# Patient Record
Sex: Female | Born: 1975 | Race: White | Hispanic: No | Marital: Married | Smoking: Former smoker
Health system: Southern US, Community
[De-identification: ages and names within clinical notes are randomized; demographics above are authoritative.]

## PROBLEM LIST (undated history)

## (undated) DIAGNOSIS — J302 Other seasonal allergic rhinitis: Secondary | ICD-10-CM

## (undated) DIAGNOSIS — T7840XA Allergy, unspecified, initial encounter: Secondary | ICD-10-CM

## (undated) DIAGNOSIS — F32A Depression, unspecified: Secondary | ICD-10-CM

## (undated) DIAGNOSIS — J45909 Unspecified asthma, uncomplicated: Secondary | ICD-10-CM

## (undated) DIAGNOSIS — F419 Anxiety disorder, unspecified: Secondary | ICD-10-CM

## (undated) HISTORY — PX: OTHER SURGICAL HISTORY: SHX169

## (undated) HISTORY — PX: ABDOMINAL SURGERY: SHX537

## (undated) HISTORY — PX: TUBAL LIGATION: SHX77

## (undated) HISTORY — DX: Depression, unspecified: F32.A

## (undated) HISTORY — DX: Allergy, unspecified, initial encounter: T78.40XA

---

## 2011-10-16 ENCOUNTER — Encounter: Payer: Self-pay | Admitting: *Deleted

## 2011-10-16 ENCOUNTER — Emergency Department (INDEPENDENT_AMBULATORY_CARE_PROVIDER_SITE_OTHER)
Admission: EM | Admit: 2011-10-16 | Discharge: 2011-10-16 | Disposition: A | Discharge status: Home / Self Care | Source: Home / Self Care | Attending: Family Medicine | Admitting: Family Medicine

## 2011-10-16 DIAGNOSIS — M940 Chondrocostal junction syndrome [Tietze]: Secondary | ICD-10-CM

## 2011-10-16 DIAGNOSIS — J069 Acute upper respiratory infection, unspecified: Secondary | ICD-10-CM

## 2011-10-16 DIAGNOSIS — J029 Acute pharyngitis, unspecified: Secondary | ICD-10-CM

## 2011-10-16 HISTORY — DX: Anxiety disorder, unspecified: F41.9

## 2011-10-16 LAB — POCT RAPID STREP A (OFFICE): Rapid Strep A Screen: NEGATIVE

## 2011-10-16 MED ORDER — AZITHROMYCIN 250 MG PO TABS: ORAL_TABLET | ORAL | Status: AC

## 2011-10-16 MED ORDER — BENZONATATE 200 MG PO CAPS: 200.0000 mg | ORAL_CAPSULE | Freq: Every day | ORAL | Status: AC

## 2011-10-16 NOTE — ED Provider Notes (Signed)
History     CSN: [id number]  Arrival date & time 10/16/11  1731   First MD Initiated Contact with Patient 10/16/11 1754      Chief Complaint  Patient presents with  . Nasal Congestion  . Sore Throat     HPI Comments: Patient complains of approximately 3 day history of gradually progressive URI symptoms beginning with a mild sore throat (now persistent), followed by progressive nasal congestion.  A cough started next.   Complains of fatigue and initial myalgias.  Cough is now worse at night and generally non-productive during the day.  There has been no pleuritic pain, bit she does complain of tightness in her anterior chest.  No shortness of breath or wheezes.  She complains of pressure in her sinuses.  The history is provided by the patient.    Past Medical History  Diagnosis Date  . Anxiety     Past Surgical History  Procedure Date  . Abdominal surgery     c-section x 2     History reviewed. No pertinent family history.  History  Substance Use Topics  . Smoking status: Former Games developer  . Smokeless tobacco: Not on file  . Alcohol Use: Yes    OB History    Grav Para Term Preterm Abortions TAB SAB Ect Mult Living                  Review of Systems + sore throat + cough No pleuritic pain, but has tightness in anterior chest No wheezing + nasal congestion + post-nasal drainage + sinus pain/pressure No itchy/red eyes + left earache No hemoptysis No SOB + fever, + chills No nausea No vomiting No abdominal pain No diarrhea No urinary symptoms No skin rashes + fatigue + myalgias + headache Used OTC meds without relief (Daquil, Nyquil, etc) Allergies  Review of patient's allergies indicates no known allergies.  Home Medications   Current Outpatient Rx  Name Route Sig Dispense Refill  . ESCITALOPRAM OXALATE 20 MG PO TABS Oral Take 20 mg by mouth daily.    . AZITHROMYCIN 250 MG PO TABS  Take 2 tabs today; then begin one tab once daily for 4 more days  (Rx void after 10/24/11) 6 each 0  . BENZONATATE 200 MG PO CAPS Oral Take 1 capsule (200 mg total) by mouth at bedtime. Take as needed for cough 12 capsule 0    BP 138/83  Pulse 71  Temp(Src) 97.8 F (36.6 C) (Oral)  Resp 16  Ht 5\' 7"  (1.702 m)  Wt 163 lb 4 oz (74.05 kg)  BMI 25.57 kg/m2  SpO2 98%  LMP 10/16/2011  Physical Exam Nursing notes and Vital Signs reviewed. Appearance:  Patient appears healthy, stated age, and in no acute distress Eyes:  Pupils are equal, round, and reactive to light and accomodation.  Extraocular movement is intact.  Conjunctivae are not inflamed  Ears:  Canals normal.  Tympanic membranes normal.  Nose:  Mildly congested turbinates.  Mild maxillary sinus tenderness is present.  Pharynx:  Normal Neck:  Supple.   Tender shotty posterior nodes are palpated bilaterally  Lungs:  Clear to auscultation.  Breath sounds are equal.  Chest:  Distinct tenderness to palpation over the mid-sternum.  Heart:  Regular rate and rhythm without murmurs, rubs, or gallops.  Abdomen:  Nontender without masses or hepatosplenomegaly.  Bowel sounds are present.  No CVA or flank tenderness.  Extremities:  No edema.  No calf tenderness Skin:  No rash present.  ED Course  Procedures  none   Labs Reviewed  POCT RAPID STREP A (OFFICE) negative      1. Acute upper respiratory infections of unspecified site   2. Acute pharyngitis   3. Costochondritis, acute       MDM  There is no evidence of bacterial infection today.   Treat symptomatically for now: Rx for Tessalon written if cough develops Take Mucinex D (guaifenesin with decongestant) twice daily for congestion.  Increase fluid intake, rest. May use Afrin nasal spray (or generic oxymetazoline) twice daily for about 5 days.  Also recommend using saline nasal spray several times daily and saline nasal irrigation (AYR is a common brand) Stop all antihistamines for now, and other non-prescription cough/cold  preparations. May take Ibuprofen 200mg , 4 tabs every 8 hours with food for chest/sternum discomfort. Begin Azithromycin if not improving about 5 days or if persistent fever develops (Given a prescription to hold, with an expiration date)  Follow-up with family doctor if not improving 7 to 10 days.        Lattie Haw, MD 10/16/11 [phone]

## 2011-10-16 NOTE — ED Notes (Signed)
Pt c/o sore throat, nasal congestion, fatigue, sinus pain/pressure, and HA x 3 days. She has taken zyrtec, nyquil, dayquil and tylenol sinus.

## 2011-10-16 NOTE — Discharge Instructions (Signed)
Take Mucinex D (guaifenesin with decongestant) twice daily for congestion.  Increase fluid intake, rest. May use Afrin nasal spray (or generic oxymetazoline) twice daily for about 5 days.  Also recommend using saline nasal spray several times daily and saline nasal irrigation (AYR is a common brand) Stop all antihistamines for now, and other non-prescription cough/cold preparations. May take Ibuprofen 200mg , 4 tabs every 8 hours with food for chest/sternum discomfort. Begin Azithromycin if not improving about 5 days or if persistent fever develops. Follow-up with family doctor if not improving 7 to 10 days.

## 2012-04-08 ENCOUNTER — Emergency Department (INDEPENDENT_AMBULATORY_CARE_PROVIDER_SITE_OTHER)
Admission: EM | Admit: 2012-04-08 | Discharge: 2012-04-08 | Disposition: A | Discharge status: Home / Self Care | Source: Home / Self Care | Attending: Family Medicine | Admitting: Family Medicine

## 2012-04-08 ENCOUNTER — Encounter: Payer: Self-pay | Admitting: *Deleted

## 2012-04-08 DIAGNOSIS — M940 Chondrocostal junction syndrome [Tietze]: Secondary | ICD-10-CM

## 2012-04-08 DIAGNOSIS — J069 Acute upper respiratory infection, unspecified: Secondary | ICD-10-CM

## 2012-04-08 MED ORDER — BENZONATATE 200 MG PO CAPS: 200.0000 mg | ORAL_CAPSULE | Freq: Every day | ORAL | Status: AC

## 2012-04-08 MED ORDER — AMOXICILLIN 875 MG PO TABS: 875.0000 mg | ORAL_TABLET | Freq: Two times a day (BID) | ORAL | Status: AC

## 2012-04-08 NOTE — ED Provider Notes (Signed)
History     CSN: [id number]  Arrival date & time 04/08/12  1048   First MD Initiated Contact with Patient 04/08/12 1149      Chief Complaint  Patient presents with  . Sore Throat  . Nasal Congestion  . Facial Pain      HPI Comments: Patient complains of approximately 4 day history of gradually progressive URI symptoms beginning with a mild sore throat (now improved), followed by progressive nasal congestion.  She denies cough. Complains of fatigue and initial myalgias.  There has been no pleuritic pain, shortness of breath, or wheezes.   She has a mild earache.  The history is provided by the patient.    Past Medical History  Diagnosis Date  . Anxiety     Past Surgical History  Procedure Date  . Abdominal surgery     c-section x 2     History reviewed. No pertinent family history.  History  Substance Use Topics  . Smoking status: Former Games developer  . Smokeless tobacco: Not on file  . Alcohol Use: Yes    OB History    Grav Para Term Preterm Abortions TAB SAB Ect Mult Living                  Review of Systems + sore throat + dizziness No cough No pleuritic pain No wheezing + nasal congestion + post-nasal drainage + sinus pain/pressure No itchy/red eyes ? right earache No hemoptysis No SOB No fever, + chills No nausea No vomiting No abdominal pain No diarrhea No urinary symptoms No skin rashes + fatigue + myalgias No headache Used OTC meds without relief  Allergies  Review of patient's allergies indicates no known allergies.  Home Medications   Current Outpatient Rx  Name Route Sig Dispense Refill  . AMOXICILLIN 875 MG PO TABS Oral Take 1 tablet (875 mg total) by mouth 2 (two) times daily. (Rx void after 04/16/12) 20 tablet 0  . BENZONATATE 200 MG PO CAPS Oral Take 1 capsule (200 mg total) by mouth at bedtime. Take as needed for cough 12 capsule 0  . ESCITALOPRAM OXALATE 20 MG PO TABS Oral Take 20 mg by mouth daily.      BP 130/85  Pulse 66   Temp 98 F (36.7 C) (Oral)  Resp 16  Ht 5\' 7"  (1.702 m)  Wt 152 lb (68.947 kg)  BMI 23.81 kg/m2  SpO2 100%  LMP 03/04/2012  Physical Exam Nursing notes and Vital Signs reviewed. Appearance:  Patient appears healthy, stated age, and in no acute distress Eyes:  Pupils are equal, round, and reactive to light and accomodation.  Extraocular movement is intact.  Conjunctivae are not inflamed  Ears:  Canals normal.  Tympanic membranes normal.  Nose:  Mildly congested turbinates.  Mild maxillary sinus tenderness is present.  Pharynx:  Normal Neck:  Supple.  Tender shotty anterior/posterior nodes are palpated bilaterally  Lungs:  Clear to auscultation.  Breath sounds are equal. Chest:  Distinct tenderness to palpation over the mid-sternum.   Heart:  Regular rate and rhythm without murmurs, rubs, or gallops.  Abdomen:  Nontender without masses or hepatosplenomegaly.  Bowel sounds are present.  No CVA or flank tenderness.  Extremities:  No edema.  No calf tenderness Skin:  No rash present.   ED Course  Procedures none   Labs Reviewed  POCT RAPID STREP A (OFFICE) negative      1. Acute upper respiratory infections of unspecified site   2. Costochondritis,  acute       MDM  There is no evidence of bacterial infection today.   Treat symptomatically for now:  Prescription written for Benzonatate [facility]) to take at bedtime for night-time cough.  Take Mucinex D (guaifenesin with decongestant) twice daily for congestion.  Increase fluid intake, rest. May use Afrin nasal spray (or generic oxymetazoline) twice daily for about 5 days.  Also recommend using saline nasal spray several times daily and saline nasal irrigation (AYR is a common brand) Stop all antihistamines for now, and other non-prescription cough/cold preparations. May take Ibuprofen 200mg , 4 tabs every 8 hours with food for chest/sternum discomfort. Begin Amoxicillin if not improving about 5 days or if persistent fever  develops (Given a prescription to hold, with an expiration date)  Follow-up with family doctor if not improving 7 to 10 days.         Lattie Haw, MD 04/09/12 1345

## 2012-04-08 NOTE — ED Notes (Signed)
Pt c/o sore throat, facial pain, nasal congestion, laryngitis, dizziness, and bilateral ear ache x 4 days.

## 2012-06-04 ENCOUNTER — Encounter: Payer: Self-pay | Admitting: Emergency Medicine

## 2012-06-04 ENCOUNTER — Emergency Department (INDEPENDENT_AMBULATORY_CARE_PROVIDER_SITE_OTHER)
Admission: EM | Admit: 2012-06-04 | Discharge: 2012-06-04 | Disposition: A | Discharge status: Home / Self Care | Source: Home / Self Care

## 2012-06-04 DIAGNOSIS — J329 Chronic sinusitis, unspecified: Secondary | ICD-10-CM

## 2012-06-04 DIAGNOSIS — R05 Cough: Secondary | ICD-10-CM

## 2012-06-04 MED ORDER — FLUTICASONE PROPIONATE 50 MCG/ACT NA SUSP: 2.0000 | Freq: Every day | NASAL | Status: DC

## 2012-06-04 MED ORDER — AMOXICILLIN-POT CLAVULANATE 875-125 MG PO TABS: 1.0000 | ORAL_TABLET | Freq: Two times a day (BID) | ORAL | Status: DC

## 2012-06-04 MED ORDER — BENZONATATE 100 MG PO CAPS: ORAL_CAPSULE | ORAL | Status: DC

## 2012-06-04 NOTE — ED Notes (Signed)
Sinus Problems, congestions, pressure, teeth hurt, dry cough x 10 days

## 2012-06-04 NOTE — ED Provider Notes (Signed)
History     CSN: [id number]  Arrival date & time 06/04/12  1335    Chief Complaint  Patient presents with  . Sinus Problem    HPI Comments: SINUSITIS Onset: 10 days Location:  left side - pressure behind eye, upper teeth pain, left maxillary sinus.  Symptoms Cough:  yes Discharge:  Green nasal discharge Fever: no Sinus Pressure: yes, left side Ears Blocked:  no Teeth Ache:  Yes, left upper teeth Frontal Headache:  no Second Sickening:  Yes, Patient has URI symptoms about 10 days ago.  Was feeling better and now feeling more fatigued, cough, and left sinus pain.  Red Flags Change in mental state:no Change in vision: no    The history is provided by the patient.    Past Medical History  Diagnosis Date  . Anxiety     Past Surgical History  Procedure Date  . Abdominal surgery     c-section x 2   . Cesarean section   . Tuval ligation   . Tubal ligation     No family history on file.  History  Substance Use Topics  . Smoking status: Former Games developer  . Smokeless tobacco: Not on file  . Alcohol Use: Yes    OB History    Grav Para Term Preterm Abortions TAB SAB Ect Mult Living                  Review of Systems  Constitutional: Positive for fatigue. Negative for fever and chills.  HENT: Positive for congestion, rhinorrhea and sinus pressure. Negative for ear pain and sore throat.   Eyes:       Pressure behind the left eye  Respiratory: Positive for cough. Negative for chest tightness and wheezing.   Cardiovascular: Negative.   Gastrointestinal: Negative.   Musculoskeletal: Negative.   Skin: Negative.   All other systems reviewed and are negative.    Allergies  Review of patient's allergies indicates not on file.  Home Medications   Current Outpatient Rx  Name Route Sig Dispense Refill  . AMOXICILLIN-POT CLAVULANATE 875-125 MG PO TABS Oral Take 1 tablet by mouth 2 (two) times daily. 14 tablet 0  . BENZONATATE 100 MG PO CAPS  Take 1-2 capsules  up to three times a day as needed for cough. 60 capsule 0  . ESCITALOPRAM OXALATE 20 MG PO TABS Oral Take 20 mg by mouth daily.    Marland Kitchen FLUTICASONE PROPIONATE 50 MCG/ACT NA SUSP Nasal Place 2 sprays into the nose daily. 16 g 2    BP 132/91  Pulse 82  Temp 98.5 F (36.9 C) (Oral)  Resp 18  Ht 5\' 7"  (1.702 m)  Wt 153 lb (69.4 kg)  BMI 23.96 kg/m2  SpO2 98%  LMP 06/02/2012  Physical Exam  Nursing note and vitals reviewed. Constitutional: She is oriented to person, place, and time. She appears well-developed and well-nourished. No distress.  HENT:  Head: Normocephalic and atraumatic.  Right Ear: Hearing normal. Tympanic membrane is bulging.  Left Ear: Hearing normal. Tympanic membrane is bulging.  Nose: Mucosal edema and rhinorrhea present. Right sinus exhibits maxillary sinus tenderness and frontal sinus tenderness. Left sinus exhibits maxillary sinus tenderness and frontal sinus tenderness.  Mouth/Throat: Uvula is midline and oropharynx is clear and moist. Mucous membranes are pale.  Cardiovascular: Normal rate, regular rhythm, normal heart sounds and intact distal pulses.  Exam reveals no gallop and no friction rub.   No murmur heard. Pulmonary/Chest: Effort normal and breath sounds normal.  She has no wheezes. She has no rales. She exhibits no tenderness.  Abdominal: Soft. Bowel sounds are normal. There is no tenderness.  Musculoskeletal: Normal range of motion.  Neurological: She is alert and oriented to person, place, and time. She has normal reflexes.  Skin: Skin is warm and dry. She is not diaphoretic.  Psychiatric: She has a normal mood and affect. Her behavior is normal. Judgment and thought content normal.    ED Course  Procedures     1. Sinusitis   2. Cough       MDM  RX for Azithromycin, Flonase, Tessalon pearl. Use saline spray 2 sprays in each nostril every three hrs as needed.   Use Flonase 1 spray in each nostril twice a day for 5 days to help diminish nasal  inflammation Take Mucinex D (guaifenesin with decongestant) twice daily for congestion.  Increase fluid intake, rest. Follow-up with family doctor if not improving 7 to 10 days.         Junius Roads, NP 06/04/12 1447

## 2012-06-05 NOTE — ED Provider Notes (Signed)
Agree with exam, assessment, and plan.   Lattie Haw, MD 06/05/12 0900

## 2012-09-06 ENCOUNTER — Emergency Department (INDEPENDENT_AMBULATORY_CARE_PROVIDER_SITE_OTHER)
Admission: EM | Admit: 2012-09-06 | Discharge: 2012-09-06 | Disposition: A | Discharge status: Home / Self Care | Source: Home / Self Care | Attending: Family Medicine | Admitting: Family Medicine

## 2012-09-06 DIAGNOSIS — R309 Painful micturition, unspecified: Secondary | ICD-10-CM

## 2012-09-06 DIAGNOSIS — N390 Urinary tract infection, site not specified: Secondary | ICD-10-CM

## 2012-09-06 DIAGNOSIS — R300 Dysuria: Secondary | ICD-10-CM

## 2012-09-06 LAB — POCT URINALYSIS DIP (MANUAL ENTRY)
Bilirubin, UA: NEGATIVE
Glucose, UA: NEGATIVE
Ketones, POC UA: NEGATIVE
Urobilinogen, UA: 0.2
pH, UA: 7

## 2012-09-06 MED ORDER — CEPHALEXIN 500 MG PO CAPS: 500.0000 mg | ORAL_CAPSULE | Freq: Three times a day (TID) | ORAL | Status: AC

## 2012-09-06 NOTE — ED Notes (Signed)
Kirsten Herrera complains of painful urination for 3 days. She has had fever, chills and sweats.

## 2012-09-06 NOTE — ED Provider Notes (Addendum)
History     CSN: [id number]  Arrival date & time 09/06/12  4098   First MD Initiated Contact with Patient 09/06/12 [phone]      Chief Complaint  Patient presents with  . Dysuria    x 3 days   HPI  DYSURIA Onset:  3 days  Description: dysuria, increased urinary frequency  Modifying factors: none   Symptoms Urgency:  yes Frequency: yes  Hesitancy:  yes Hematuria:  no Flank Pain:  no Fever: no Nausea/Vomiting:  no Missed LMP: no STD exposure: no Discharge: no Irritants: no Rash: no  Red Flags   More than 3 UTI's last 12 months:  no PMH of  Diabetes or Immunosuppression:  no Renal Disease/Calculi: no Urinary Tract Abnormality:  no Instrumentation or Trauma: no    Past Medical History  Diagnosis Date  . Anxiety     Past Surgical History  Procedure Date  . Abdominal surgery     c-section x 2   . Cesarean section   . Tuval ligation   . Tubal ligation     History reviewed. No pertinent family history.  History  Substance Use Topics  . Smoking status: Former Games developer  . Smokeless tobacco: Not on file  . Alcohol Use: Yes    OB History    Grav Para Term Preterm Abortions TAB SAB Ect Mult Living                  Review of Systems  All other systems reviewed and are negative.    Allergies  Review of patient's allergies indicates no known allergies.  Home Medications   Current Outpatient Rx  Name  Route  Sig  Dispense  Refill  . AMOXICILLIN-POT CLAVULANATE 875-125 MG PO TABS   Oral   Take 1 tablet by mouth 2 (two) times daily.   14 tablet   0   . BENZONATATE 100 MG PO CAPS      Take 1-2 capsules up to three times a day as needed for cough.   60 capsule   0   . ESCITALOPRAM OXALATE 20 MG PO TABS   Oral   Take 20 mg by mouth daily.         Marland Kitchen FLUTICASONE PROPIONATE 50 MCG/ACT NA SUSP   Nasal   Place 2 sprays into the nose daily.   16 g   2     There were no vitals taken for this visit.  Physical Exam  Constitutional: She  appears well-developed and well-nourished.  HENT:  Head: Normocephalic and atraumatic.  Eyes: Conjunctivae normal are normal. Pupils are equal, round, and reactive to light.  Neck: Normal range of motion. Neck supple.  Cardiovascular: Normal rate and regular rhythm.   Pulmonary/Chest: Effort normal and breath sounds normal.  Abdominal: Soft. Bowel sounds are normal.       No flank pain  + suprapubic tenderness   Musculoskeletal: Normal range of motion.  Neurological: She is alert.  Skin: Skin is warm.    ED Course  Procedures (including critical care time)   Labs Reviewed  URINE CULTURE  POCT URINALYSIS DIP (MANUAL ENTRY)   No results found.   1. Pain passing urine   2. UTI (lower urinary tract infection)       MDM  Will treat with keflex.  Urine culture.  Discussed infectious and GU red flags.  Follow up as needed.     The patient and/or caregiver has been counseled thoroughly with regard to treatment  plan and/or medications prescribed including dosage, schedule, interactions, rationale for use, and possible side effects and they verbalize understanding. Diagnoses and expected course of recovery discussed and will return if not improved as expected or if the condition worsens. Patient and/or caregiver verbalized understanding.               Doree Albee, MD 09/06/12 4098  Doree Albee, MD 09/06/12 [phone]

## 2012-09-08 ENCOUNTER — Telehealth: Payer: Self-pay | Admitting: *Deleted

## 2013-03-30 ENCOUNTER — Emergency Department (INDEPENDENT_AMBULATORY_CARE_PROVIDER_SITE_OTHER)

## 2013-03-30 ENCOUNTER — Encounter: Payer: Self-pay | Admitting: *Deleted

## 2013-03-30 ENCOUNTER — Emergency Department (INDEPENDENT_AMBULATORY_CARE_PROVIDER_SITE_OTHER)
Admission: EM | Admit: 2013-03-30 | Discharge: 2013-03-30 | Disposition: A | Discharge status: Home / Self Care | Source: Home / Self Care | Attending: Emergency Medicine | Admitting: Emergency Medicine

## 2013-03-30 DIAGNOSIS — S0993XA Unspecified injury of face, initial encounter: Secondary | ICD-10-CM

## 2013-03-30 DIAGNOSIS — X58XXXA Exposure to other specified factors, initial encounter: Secondary | ICD-10-CM

## 2013-03-30 DIAGNOSIS — S0083XA Contusion of other part of head, initial encounter: Secondary | ICD-10-CM

## 2013-03-30 DIAGNOSIS — S0003XA Contusion of scalp, initial encounter: Secondary | ICD-10-CM

## 2013-03-30 MED ORDER — HYDROCODONE-ACETAMINOPHEN 5-325 MG PO TABS: 1.0000 | ORAL_TABLET | Freq: Four times a day (QID) | ORAL | Status: DC | PRN
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
## 2013-06-09 ENCOUNTER — Emergency Department (INDEPENDENT_AMBULATORY_CARE_PROVIDER_SITE_OTHER)
Admission: EM | Admit: 2013-06-09 | Discharge: 2013-06-09 | Disposition: A | Discharge status: Home / Self Care | Source: Home / Self Care | Attending: Emergency Medicine | Admitting: Emergency Medicine

## 2013-06-09 ENCOUNTER — Encounter: Payer: Self-pay | Admitting: Emergency Medicine

## 2013-06-09 DIAGNOSIS — J0100 Acute maxillary sinusitis, unspecified: Secondary | ICD-10-CM

## 2013-06-09 DIAGNOSIS — J209 Acute bronchitis, unspecified: Secondary | ICD-10-CM

## 2013-06-09 MED ORDER — AZITHROMYCIN 250 MG PO TABS: ORAL_TABLET | ORAL | Status: DC

## 2013-06-09 NOTE — ED Notes (Signed)
[place] c/o 3 days of nasal congestion, non-productive cough, decreased energy level and pain in both ears. No flu vaccine this year.

## 2013-06-09 NOTE — ED Provider Notes (Signed)
CSN: [id number]     Arrival date & time 06/09/13  1542 History   First MD Initiated Contact with Patient 06/09/13 1552     Chief Complaint  Patient presents with  . URI   (Consider location/radiation/quality/duration/timing/severity/associated sxs/prior Treatment) HPI URI HISTORY  Kirsten Herrera is a 37 y.o. female who complains of onset of cold symptoms for 5 days.  Have been using over-the-counter treatment which helps a little bit.  Mild chills/sweats + Mild, low-grade Fever  +  Nasal congestion +  Discolored Post-nasal drainage Mild sinus pain/pressure No sore throat  +  cough, nonproductive No wheezing No chest congestion No hemoptysis No shortness of breath No pleuritic pain  No itchy/red eyes No earache  No nausea No vomiting No abdominal pain No diarrhea  No skin rashes +  Fatigue No myalgias No headache  Past Medical History  Diagnosis Date  . Anxiety    Past Surgical History  Procedure Laterality Date  . Abdominal surgery      c-section x 2   . Cesarean section    . Tuval ligation    . Tubal ligation     Family History  Problem Relation Age of Onset  . Cancer Other     throat   History  Substance Use Topics  . Smoking status: Former Games developer  . Smokeless tobacco: Never Used  . Alcohol Use: Yes   OB History   Grav Para Term Preterm Abortions TAB SAB Ect Mult Living                 Review of Systems  All other systems reviewed and are negative.    Allergies  Review of patient's allergies indicates no known allergies.  Home Medications   Current Outpatient Rx  Name  Route  Sig  Dispense  Refill  . azithromycin (ZITHROMAX Z-PAK) 250 MG tablet      Take 2 tablets on day one, then 1 tablet daily on days 2 through 5   1 each   0   . escitalopram (LEXAPRO) 20 MG tablet   Oral   Take 20 mg by mouth daily.         . fluticasone (FLONASE) 50 MCG/ACT nasal spray   Nasal   Place 2 sprays into the nose daily.   16 g   2    BP  135/83  Pulse 73  Temp(Src) 98.2 F (36.8 C) (Oral)  Resp 16  Wt 148 lb (67.132 kg)  SpO2 100% Physical Exam  Nursing note and vitals reviewed. Constitutional: She is oriented to person, place, and time. She appears well-developed and well-nourished. No distress.  HENT:  Head: Normocephalic and atraumatic.  Right Ear: Tympanic membrane, external ear and ear canal normal.  Left Ear: Tympanic membrane, external ear and ear canal normal.  Nose: Mucosal edema and rhinorrhea present. Right sinus exhibits maxillary sinus tenderness. Left sinus exhibits maxillary sinus tenderness.  Mouth/Throat: Oropharynx is clear and moist. No oral lesions. No oropharyngeal exudate.  Eyes: Right eye exhibits no discharge. Left eye exhibits no discharge. No scleral icterus.  Neck: Neck supple.  Cardiovascular: Normal rate, regular rhythm and normal heart sounds.   Pulmonary/Chest: Effort normal. She has no wheezes. She has no rales.   A few anterior rhonchi, otherwise clear  Lymphadenopathy:    She has no cervical adenopathy.  Neurological: She is alert and oriented to person, place, and time.  Skin: Skin is warm and dry.    ED Course  Procedures (including critical  care time) Labs Review Labs Reviewed - No data to display Imaging Review No results found.  EKG Interpretation     Ventricular Rate:    PR Interval:    QRS Duration:   QT Interval:    QTC Calculation:   R Axis:     Text Interpretation:              MDM   1. Acute maxillary sinusitis   2. Acute bronchitis    Z-Pak. Mucinex D. Use the Flonase which he has at home Other symptomatic care discussed Followup with PCP if no better one week, sooner when necessary Precautions discussed. Red flags discussed. Questions invited and answered. Patient voiced understanding and agreement.    Lajean Manes, MD 06/09/13 [phone]

## 2013-08-17 ENCOUNTER — Encounter: Payer: Self-pay | Admitting: Emergency Medicine

## 2013-08-17 ENCOUNTER — Emergency Department (INDEPENDENT_AMBULATORY_CARE_PROVIDER_SITE_OTHER)
Admission: EM | Admit: 2013-08-17 | Discharge: 2013-08-17 | Disposition: A | Discharge status: Home / Self Care | Source: Home / Self Care | Attending: Family Medicine | Admitting: Family Medicine

## 2013-08-17 ENCOUNTER — Emergency Department (INDEPENDENT_AMBULATORY_CARE_PROVIDER_SITE_OTHER)

## 2013-08-17 DIAGNOSIS — R05 Cough: Secondary | ICD-10-CM

## 2013-08-17 DIAGNOSIS — R059 Cough, unspecified: Secondary | ICD-10-CM

## 2013-08-17 DIAGNOSIS — J209 Acute bronchitis, unspecified: Secondary | ICD-10-CM

## 2013-08-17 DIAGNOSIS — R509 Fever, unspecified: Secondary | ICD-10-CM

## 2013-08-17 MED ORDER — CLARITHROMYCIN 500 MG PO TABS: 500.0000 mg | ORAL_TABLET | Freq: Two times a day (BID) | ORAL | Status: DC

## 2013-08-17 MED ORDER — GUAIFENESIN-CODEINE 100-10 MG/5ML PO SOLN: ORAL | Status: DC

## 2013-08-17 NOTE — Discharge Instructions (Signed)
Continue Mucinex D (1200mg  guaifenesin with decongestant) twice daily for congestion.  Increase fluid intake, rest. May use Afrin nasal spray (or generic oxymetazoline) twice daily for about 5 days.  Also recommend using saline nasal spray several times daily and saline nasal irrigation (AYR is a common brand) Stop all antihistamines for now, and other non-prescription cough/cold preparations. Continue albuterol inhaler as needed. Recommend a flu shot when well. Marland Kitchen

## 2013-08-17 NOTE — ED Provider Notes (Signed)
CSN: [id number]     Arrival date & time 08/17/13  1442 History   First MD Initiated Contact with Patient 08/17/13 1534     Chief Complaint  Patient presents with  . Cough      HPI Comments: Approximately 2.5 weeks ago patient developed flu-like symptoms with fever, myalgias, sinus congestion, fatigue, and a productive cough.  She became better last week, although her cough persisted, then over the past two days she developed fatigue and bilateral back pain with cough.  Her cough is partly productive, and more productive when she uses an albuterol inhaler.  Over the past two days she has had fever to 102.  The history is provided by the patient.    Past Medical History  Diagnosis Date  . Anxiety    Past Surgical History  Procedure Laterality Date  . Abdominal surgery      c-section x 2   . Cesarean section    . Tuval ligation    . Tubal ligation     Family History  Problem Relation Age of Onset  . Cancer Other     throat   History  Substance Use Topics  . Smoking status: Former Smoker    Quit date: 08/17/2005  . Smokeless tobacco: Never Used  . Alcohol Use: Yes   OB History   Grav Para Term Preterm Abortions TAB SAB Ect Mult Living                 Review of Systems No sore throat + cough No pleuritic pain No wheezing No nasal congestion ? post-nasal drainage No sinus pain/pressure No itchy/red eyes No earache No hemoptysis + SOB + fever, + chills No nausea No vomiting No abdominal pain No diarrhea No urinary symptoms No skin rash + fatigue No myalgias + headache Used OTC meds without relief  Allergies  Review of patient's allergies indicates no known allergies.  Home Medications   Current Outpatient Rx  Name  Route  Sig  Dispense  Refill  . clarithromycin (BIAXIN) 500 MG tablet   Oral   Take 1 tablet (500 mg total) by mouth 2 (two) times daily. Take with food.   14 tablet   0   . fluticasone (FLONASE) 50 MCG/ACT nasal spray   Nasal   Place  2 sprays into the nose daily.   16 g   2   . guaiFENesin-codeine 100-10 MG/5ML syrup      Take 79mL by mouth at bedtime as needed for cough   120 mL   0    BP 118/84  Pulse 86  Temp(Src) 98.7 F (37.1 C) (Oral)  Resp 16  Wt 148 lb (67.132 kg)  SpO2 98%  LMP 07/30/2013 Physical Exam Nursing notes and Vital Signs reviewed. Appearance:  Patient appears healthy, stated age, and in no acute distress Eyes:  Pupils are equal, round, and reactive to light and accomodation.  Extraocular movement is intact.  Conjunctivae are not inflamed  Ears:  Canals normal.  Tympanic membranes normal.  Nose:  Mildly congested turbinates.  No sinus tenderness.   Pharynx:  Normal Neck:  Supple.  No adenopathy Lungs:  Clear to auscultation.  Breath sounds are equal.  Heart:  Regular rate and rhythm without murmurs, rubs, or gallops.  Abdomen:  Nontender without masses or hepatosplenomegaly.  Bowel sounds are present.  No CVA or flank tenderness.  Extremities:  No edema.  No calf tenderness Skin:  No rash present.   ED Course  Procedures  none   Imaging Review Dg Chest 2 View  08/17/2013   CLINICAL DATA:  Cough and fever  EXAM: CHEST  2 VIEW  COMPARISON:  None.  FINDINGS: Lungs are clear. Heart size and pulmonary vascularity are normal. No adenopathy. No bone lesions.  IMPRESSION: No abnormality noted.   Electronically Signed   By: Lowella Grip M.D.   On: 08/17/2013 16:01      MDM   1. Acute bronchitis; ?pertussis or atypical agent    Begin Biaxin.  Robitussin AC at bedtime prn Continue Mucinex D (1200mg  guaifenesin with decongestant) twice daily for congestion.  Increase fluid intake, rest. May use Afrin nasal spray (or generic oxymetazoline) twice daily for about 5 days.  Also recommend using saline nasal spray several times daily and saline nasal irrigation (AYR is a common brand) Stop all antihistamines for now, and other non-prescription cough/cold preparations. Continue albuterol  inhaler as needed. Recommend a flu shot when well. .  Followup with Family Doctor if not improved in one week.     Kandra Nicolas, MD 08/18/13 [phone]

## 2013-08-17 NOTE — ED Notes (Signed)
Kirsten Herrera c/o fever and body aches that started 07/31/13. Later developed cough, fever and body aches resolved. Her cough is constant, productive, painful, c/o fatigue and SOB. No lung disease, former smoker.

## 2013-08-27 ENCOUNTER — Telehealth: Payer: Self-pay | Admitting: *Deleted

## 2013-08-27 NOTE — Telephone Encounter (Signed)
I called her, she is not interested in a PCP.  She is covered through [employer] and per her, insurance would prefer her family to be seen at Urgent Care. [place]  ===View-only below this line===  ----- Message -----    From: Jonathon Resides, MD    Sent: 08/18/2013   8:43 AM      To: Sandi Raveling  This patient was seen in the ED and was told to follow up with [place].

## 2013-09-23 ENCOUNTER — Emergency Department (INDEPENDENT_AMBULATORY_CARE_PROVIDER_SITE_OTHER)
Admission: EM | Admit: 2013-09-23 | Discharge: 2013-09-23 | Disposition: A | Discharge status: Home / Self Care | Source: Home / Self Care | Attending: Family Medicine | Admitting: Family Medicine

## 2013-09-23 ENCOUNTER — Emergency Department (INDEPENDENT_AMBULATORY_CARE_PROVIDER_SITE_OTHER)

## 2013-09-23 ENCOUNTER — Encounter: Payer: Self-pay | Admitting: Emergency Medicine

## 2013-09-23 DIAGNOSIS — R059 Cough, unspecified: Secondary | ICD-10-CM

## 2013-09-23 DIAGNOSIS — J310 Chronic rhinitis: Secondary | ICD-10-CM

## 2013-09-23 DIAGNOSIS — J342 Deviated nasal septum: Secondary | ICD-10-CM

## 2013-09-23 DIAGNOSIS — R05 Cough: Secondary | ICD-10-CM

## 2013-09-23 DIAGNOSIS — R053 Chronic cough: Secondary | ICD-10-CM

## 2013-09-23 LAB — POCT CBC W AUTO DIFF (K'VILLE URGENT CARE)

## 2013-09-23 MED ORDER — PREDNISONE 20 MG PO TABS: 20.0000 mg | ORAL_TABLET | Freq: Two times a day (BID) | ORAL | Status: DC

## 2013-09-23 MED ORDER — FLUTICASONE PROPIONATE 50 MCG/ACT NA SUSP: NASAL | Status: DC

## 2013-09-23 MED ORDER — FLUTICASONE-SALMETEROL 100-50 MCG/DOSE IN AEPB: 1.0000 | INHALATION_SPRAY | Freq: Two times a day (BID) | RESPIRATORY_TRACT | Status: DC

## 2013-09-23 MED ORDER — ALBUTEROL SULFATE HFA 108 (90 BASE) MCG/ACT IN AERS: 2.0000 | INHALATION_SPRAY | RESPIRATORY_TRACT | Status: DC | PRN

## 2013-09-23 NOTE — ED Provider Notes (Signed)
CSN: [id number]     Arrival date & time 09/23/13  2130 History   First MD Initiated Contact with Patient 09/23/13 [phone]     Chief Complaint  Patient presents with  . Cough        HPI Comments: Patient was treated for bronchitis approximately one month ago with Biaxin.  She subsequently improved although a mild cough persisted.  She has remained fatigued.  About two weeks ago she developed increased sinus congestion, and her cough has increased over the past week.  Two days ago she had some chills.  She has had shortness of breath at times.  Her son has asthma and she tried using his albuterol inhaler which improved her symptoms.  The history is provided by the patient.    Past Medical History  Diagnosis Date  . Anxiety    Past Surgical History  Procedure Laterality Date  . Abdominal surgery      c-section x 2   . Cesarean section    . Tuval ligation    . Tubal ligation     Family History  Problem Relation Age of Onset  . Cancer Other     throat   History  Substance Use Topics  . Smoking status: Former Smoker    Quit date: 08/17/2005  . Smokeless tobacco: Never Used  . Alcohol Use: Yes   OB History   Grav Para Term Preterm Abortions TAB SAB Ect Mult Living                 Review of Systems + sore throat + hoarseness + cough No pleuritic pain No wheezing + nasal congestion + post-nasal drainage + sinus pain/pressure No itchy/red eyes No earache No hemoptysis + SOB No fever, + chills No nausea No vomiting No abdominal pain No diarrhea No urinary symptoms No skin rash + fatigue No myalgias No headache Used OTC meds without relief    Allergies  Review of patient's allergies indicates not on file.  Home Medications   Current Outpatient Rx  Name  Route  Sig  Dispense  Refill  . albuterol (PROVENTIL HFA;VENTOLIN HFA) 108 (90 BASE) MCG/ACT inhaler   Inhalation   Inhale 2 puffs into the lungs every 4 (four) hours as needed for wheezing or shortness  of breath.   1 Inhaler   0   . fluticasone (FLONASE) 50 MCG/ACT nasal spray      Place 2 sprays in each nostril once daily   16 g   1   . Fluticasone-Salmeterol (ADVAIR DISKUS) 100-50 MCG/DOSE AEPB   Inhalation   Inhale 1 puff into the lungs 2 (two) times daily.   60 each   1   . predniSONE (DELTASONE) 20 MG tablet   Oral   Take 1 tablet (20 mg total) by mouth 2 (two) times daily. Take with food.   10 tablet   0    BP 137/88  Pulse 80  Temp(Src) 98.4 F (36.9 C) (Oral)  Ht 5\' 7"  (1.702 m)  Wt 155 lb (70.308 kg)  BMI 24.27 kg/m2  SpO2 99%  LMP 08/27/2013 Physical Exam Nursing notes and Vital Signs reviewed. Appearance:  Patient appears healthy, stated age, and in no acute distress Eyes:  Pupils are equal, round, and reactive to light and accomodation.  Extraocular movement is intact.  Conjunctivae are not inflamed  Ears:  Canals normal.  Tympanic membranes normal.  Nose:  Congested turbinates.  Maxillary and frontal sinus tenderness is present.  Pharynx:  Normal Neck:  Supple.  Slightly tender shotty posterior nodes are palpated bilaterally  Lungs:  Clear to auscultation.  Breath sounds are equal.  Heart:  Regular rate and rhythm without murmurs, rubs, or gallops.  Abdomen:  Nontender without masses or hepatosplenomegaly.  Bowel sounds are present.  No CVA or flank tenderness.  Extremities:  No edema.  No calf tenderness Skin:  No rash present.   ED Course  Procedures  none    Labs Reviewed  POCT CBC W AUTO DIFF ([facility]):  WBC 8.9; LY 27.3; MO 7.9; GR 64.8; Hgb 13.8; Platelets 322    Imaging Review Dg Sinuses Complete  09/23/2013   CLINICAL DATA:  Sinus congestion  EXAM: PARANASAL SINUSES - COMPLETE 3 + VIEW  COMPARISON:  March 30, 2013  FINDINGS: Water's, lateral, [place], and submentovertex images were obtained. Paranasal sinuses are clear. No air-fluid level. No bony destruction or expansion. There is slight rightward deviation of the nasal  septum.  IMPRESSION: Paranasal sinuses appear clear. Mild rightward deviation of nasal septum.   Electronically Signed   By: Lowella Grip M.D.   On: 09/23/2013 10:19      MDM   Final diagnoses:  Persistent dry cough; ?post-infectious with bronchospasm  Rhinitis    Begin prednisone burst, Advair.  May continue albuterol escape inhaler. Begin Flonase nasal spray. Take plain Mucinex (1200 mg guaifenesin) twice daily for cough and congestion.  May add Sudafed for sinus congestion.   Increase fluid intake, rest. May use Afrin nasal spray (or generic oxymetazoline) twice daily for about 5 days.  Also recommend using saline nasal spray several times daily and saline nasal irrigation (AYR is a common brand).  Use Flonase nasal spray after using Afrin and saline spray/irrigation daily.    Kandra Nicolas, MD 09/25/13 [phone]

## 2013-09-23 NOTE — Discharge Instructions (Signed)
Take plain Mucinex (1200 mg guaifenesin) twice daily for cough and congestion.  May add Sudafed for sinus congestion.   Increase fluid intake, rest. May use Afrin nasal spray (or generic oxymetazoline) twice daily for about 5 days.  Also recommend using saline nasal spray several times daily and saline nasal irrigation (AYR is a common brand).  Use Flonase nasal spray after using Afrin and saline spray/irrigation daily.

## 2013-09-23 NOTE — ED Notes (Signed)
Productive cough, fatigue, on-going. Takes abt gets better and cough comes back

## 2013-10-07 ENCOUNTER — Telehealth: Payer: Self-pay | Admitting: *Deleted

## 2014-03-26 ENCOUNTER — Emergency Department (INDEPENDENT_AMBULATORY_CARE_PROVIDER_SITE_OTHER)

## 2014-03-26 ENCOUNTER — Encounter: Payer: Self-pay | Admitting: Emergency Medicine

## 2014-03-26 ENCOUNTER — Emergency Department (INDEPENDENT_AMBULATORY_CARE_PROVIDER_SITE_OTHER)
Admission: EM | Admit: 2014-03-26 | Discharge: 2014-03-26 | Disposition: A | Discharge status: Home / Self Care | Source: Home / Self Care | Attending: Family Medicine | Admitting: Family Medicine

## 2014-03-26 DIAGNOSIS — M5416 Radiculopathy, lumbar region: Secondary | ICD-10-CM

## 2014-03-26 DIAGNOSIS — M4120 Other idiopathic scoliosis, site unspecified: Secondary | ICD-10-CM

## 2014-03-26 DIAGNOSIS — IMO0002 Reserved for concepts with insufficient information to code with codable children: Secondary | ICD-10-CM

## 2014-03-26 HISTORY — DX: Unspecified asthma, uncomplicated: J45.909

## 2014-03-26 MED ORDER — PREDNISONE 20 MG PO TABS: 20.0000 mg | ORAL_TABLET | Freq: Two times a day (BID) | ORAL | Status: DC

## 2014-03-26 MED ORDER — METAXALONE 800 MG PO TABS
800.0000 mg | ORAL_TABLET | Freq: Three times a day (TID) | ORAL | Status: DC
Start: 2014-03-26 — End: 2014-04-02

## 2014-03-26 MED ORDER — HYDROCODONE-ACETAMINOPHEN 5-325 MG PO TABS: ORAL_TABLET | ORAL | Status: DC

## 2014-03-26 NOTE — ED Notes (Signed)
Felt back muscle pull during exercise class this morning; difficult to sit and walk due to pain. Took ibuprofen 800mg  po at 12:30pm today.

## 2014-03-26 NOTE — ED Provider Notes (Signed)
CSN: [id number]     Arrival date & time 03/26/14  1516 History   First MD Initiated Contact with Patient 03/26/14 1600     Chief Complaint  Patient presents with  . 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  During a yoga class this morning about 11:30am she felt a sudden stabbing pain in her left lower back/buttock.  Later she developed a sharp pain radiating to her left posterior leg, wrapping around to the dorsum of her left foot.  This radicular pain became worse after she began applying a heating pad.  There was slight improvement with ibuprofen 800mg .  No bowel or bladder dysfunction, and no saddle numbness. She had a similar injury in 2010 that lasted for several months.  Patient is a 38 y.o. female presenting with back pain. The history is provided by the patient.  Back Pain Location:  Lumbar spine and gluteal region Quality:  Stabbing and aching Radiates to:  L posterior upper leg and L foot Pain severity:  Moderate Pain is:  Same all the time Onset quality:  Sudden Duration:  4 hours Timing:  Constant Progression:  Worsening Chronicity:  New Context comment:  Exercise class Relieved by:  Nothing Worsened by:  Movement and sitting Ineffective treatments:  NSAIDs Associated symptoms: leg pain and paresthesias   Associated symptoms: no abdominal pain, no bladder incontinence, no bowel incontinence, no dysuria, no fever, no numbness, no pelvic pain, no perianal numbness, no tingling, no weakness and no weight loss     Past Medical History  Diagnosis Date  . Anxiety   . Asthma    Past Surgical History  Procedure Laterality Date  . Abdominal surgery      c-section x 2   . Cesarean section    . Tuval ligation    . Tubal ligation     Family History  Problem Relation Age of Onset  . Cancer Other     throat   History  Substance Use Topics  . Smoking status: Former Smoker    Quit date: 08/17/2005  . Smokeless tobacco: Never Used  . Alcohol Use:  Yes   OB History   Grav Para Term Preterm Abortions TAB SAB Ect Mult Living                 Review of Systems  Constitutional: Negative for fever and weight loss.  Gastrointestinal: Negative for abdominal pain and bowel incontinence.  Genitourinary: Negative for bladder incontinence, dysuria and pelvic pain.  Musculoskeletal: Positive for back pain.  Neurological: Positive for paresthesias. Negative for tingling, weakness and numbness.  All other systems reviewed and are negative.   Allergies  Review of patient's allergies indicates no known allergies.  Home Medications   Prior to Admission medications   Medication Sig Start Date End Date Taking? Authorizing Provider  albuterol (PROVENTIL HFA;VENTOLIN HFA) 108 (90 BASE) MCG/ACT inhaler Inhale 2 puffs into the lungs every 4 (four) hours as needed for wheezing or shortness of breath. 09/23/13   Kandra Nicolas, MD  fluticasone Asencion Islam) 50 MCG/ACT nasal spray Place 2 sprays in each nostril once daily 09/23/13   Kandra Nicolas, MD  Fluticasone-Salmeterol (ADVAIR DISKUS) 100-50 MCG/DOSE AEPB Inhale 1 puff into the lungs 2 (two) times daily. 09/23/13   Kandra Nicolas, MD  HYDROcodone-acetaminophen (NORCO/VICODIN) 5-325 MG per tablet Take one by mouth at bedtime as needed for pain 03/26/14   Kandra Nicolas, MD  metaxalone [facility]) 800  MG tablet Take 1 tablet (800 mg total) by mouth 3 (three) times daily. 03/26/14   Kandra Nicolas, MD  predniSONE (DELTASONE) 20 MG tablet Take 1 tablet (20 mg total) by mouth 2 (two) times daily. Take with food. 03/26/14   Kandra Nicolas, MD   BP 116/78  Pulse 69  Temp(Src) 97.7 F (36.5 C) (Oral)  Resp 16  Ht 5\' 7"  (1.702 m)  Wt 145 lb (65.772 kg)  BMI 22.71 kg/m2  SpO2 100%  LMP 03/20/2014 Physical Exam  Nursing note and vitals reviewed. Constitutional: She is oriented to person, place, and time. She appears well-developed and well-nourished. No distress.  HENT:  Head: Normocephalic.  Eyes:  Conjunctivae are normal. Pupils are equal, round, and reactive to light.  Neck: Normal range of motion.  Cardiovascular: Normal heart sounds.   Pulmonary/Chest: Breath sounds normal.  Abdominal: Bowel sounds are normal. There is no tenderness.  Musculoskeletal:       Lumbar back: She exhibits decreased range of motion, tenderness and pain. She exhibits no bony tenderness and no swelling.       Back:  Back: Decreased range of motion.  Can heel/toe walk and squat without difficulty. Tenderness in the midline and left paraspinous muscles as noted on diagram. Straight leg raising test is positive on the left at about 30 degrees.  Sitting knee extension test is negative.  Strength and sensation in the lower extremities is normal.  Patellar and achilles reflexes are present.  Neurological: She is alert and oriented to person, place, and time.  Skin: Skin is warm and dry.    ED Course  Procedures none     Imaging Review Dg Lumbar Spine Complete  03/26/2014   CLINICAL DATA:  Low back pain with left-sided radicular symptoms; recent trauma  EXAM: LUMBAR SPINE - COMPLETE 4+ VIEW  COMPARISON:  None.  FINDINGS: Frontal, lateral, spot lumbosacral lateral, and bilateral oblique views were obtained. The there are 5 non-rib-bearing lumbar type vertebral bodies. There is slight thoracolumbar dextroscoliosis. There is no fracture or spondylolisthesis. Disc spaces appear intact. There is mild facet osteoarthritic change at L5-S1 bilaterally.  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 Slight scoliosis. No fracture or spondylolisthesis.   Electronically Signed   By: Lowella Grip M.D.   On: 03/26/2014 16:41     MDM   1. Lumbar back pain with radiculopathy affecting left lower extremity    Begin prednisone burst.  Rx for Skelaxin.  Lortab at bedtime prn Apply ice pack for 30 minutes, 3 to 4 times daily until symptoms improve.  Limit physical activity until improved. Followup with Dr. Aundria Mems ([facility]) if not improving about about one week.    Kandra Nicolas, MD 03/28/14 [phone]

## 2014-03-26 NOTE — Discharge Instructions (Signed)
Apply ice pack for 30 minutes, 3 to 4 times daily until symptoms improve.  Limit physical activity until improved.

## 2014-03-29 ENCOUNTER — Telehealth: Payer: Self-pay | Admitting: Emergency Medicine

## 2014-03-29 NOTE — ED Notes (Signed)
Back pain has not subsided but is able to sleep at night with medication help. Encouraged her to call and set up appt.with Dr.T.

## 2014-04-02 ENCOUNTER — Ambulatory Visit (INDEPENDENT_AMBULATORY_CARE_PROVIDER_SITE_OTHER): Admitting: Family Medicine

## 2014-04-02 ENCOUNTER — Encounter: Payer: Self-pay | Admitting: Family Medicine

## 2014-04-02 VITALS — BP 133/85 | HR 80 | Ht 67.0 in | Wt 145.0 lb

## 2014-04-02 DIAGNOSIS — M5416 Radiculopathy, lumbar region: Secondary | ICD-10-CM

## 2014-04-02 DIAGNOSIS — IMO0002 Reserved for concepts with insufficient information to code with codable children: Secondary | ICD-10-CM

## 2014-04-02 MED ORDER — DIAZEPAM 5 MG PO TABS: 5.0000 mg | ORAL_TABLET | Freq: Four times a day (QID) | ORAL | Status: DC | PRN

## 2014-04-02 MED ORDER — PREDNISONE (PAK) 10 MG PO TABS: ORAL_TABLET | ORAL | Status: DC

## 2014-04-02 MED ORDER — OXYCODONE-ACETAMINOPHEN 10-325 MG PO TABS: 1.0000 | ORAL_TABLET | Freq: Four times a day (QID) | ORAL | Status: DC | PRN

## 2014-04-02 NOTE — Patient Instructions (Signed)
You have lumbar radiculopathy (a pinched nerve in your low back). Take tylenol for baseline pain relief (1-2 extra strength tabs 3x/day) A prednisone dose pack is the best option for immediate relief and may be prescribed. Percocet as needed for severe pain (no driving on this medicine). Valium as needed for muscle spasms (no driving on this medicine if it makes you sleepy).  Also take one of these 30 minutes before the MRI.

## 2014-04-03 ENCOUNTER — Ambulatory Visit (HOSPITAL_BASED_OUTPATIENT_CLINIC_OR_DEPARTMENT_OTHER)
Admission: RE | Admit: 2014-04-03 | Discharge: 2014-04-03 | Disposition: A | Discharge status: Home / Self Care | Source: Ambulatory Visit | Attending: Family Medicine | Admitting: Family Medicine

## 2014-04-03 DIAGNOSIS — M545 Low back pain, unspecified: Secondary | ICD-10-CM | POA: Insufficient documentation

## 2014-04-03 DIAGNOSIS — M79609 Pain in unspecified limb: Secondary | ICD-10-CM | POA: Insufficient documentation

## 2014-04-03 DIAGNOSIS — M5126 Other intervertebral disc displacement, lumbar region: Secondary | ICD-10-CM | POA: Diagnosis not present

## 2014-04-03 DIAGNOSIS — M5416 Radiculopathy, lumbar region: Secondary | ICD-10-CM

## 2014-04-05 ENCOUNTER — Encounter: Payer: Self-pay | Admitting: Family Medicine

## 2014-04-05 DIAGNOSIS — M545 Low back pain, unspecified: Secondary | ICD-10-CM | POA: Insufficient documentation

## 2014-04-05 NOTE — Assessment & Plan Note (Signed)
history and exam classic for lumbar radiculopathy, likely L4 region based on her description of where the numbness wraps in her leg.  Unfortunately didn't improve with short prednisone burst, norco, skelaxin.  I don't think she would tolerate physical therapy at this time.  Will do longer 12 day prednisone course, valium, percocet.  Concern is she has a large disc herniation that may require surgery.  Will get an MRI of lumbar spine and have her scheduled follow-up with neurosurgery on Monday.

## 2014-04-05 NOTE — Progress Notes (Addendum)
Patient ID: Kirsten Herrera, female   DOB: [birth year], 38 y.o.   MRN: [id number]  PCP: Aundria Mems, MD  Subjective:   HPI: Patient is a 38 y.o. female here for severe low back pain.  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 During class she felt a sharp twinge on left side of low back. Some pain but able to continue with class. Over 15 minutes though and on car ride home had severe low back pain with shooting pains into left leg. Given prednisone, metaxolone, norco - only mild benefit with these. No prior issues with her back. Has numbness/tingling into left leg that wraps around to medial side of foot. No bowel/bladder dysfunction. Radiographs 8/21 showed mild OA at L5-S1, slight scoliosis. Pain is currently 10/10 and has worsened over past few days.  Past Medical History  Diagnosis Date  . Anxiety   . Asthma     Current Outpatient Prescriptions on File Prior to Visit  Medication Sig Dispense Refill  . albuterol (PROVENTIL HFA;VENTOLIN HFA) 108 (90 BASE) MCG/ACT inhaler Inhale 2 puffs into the lungs every 4 (four) hours as needed for wheezing or shortness of breath.  1 Inhaler  0  . fluticasone (FLONASE) 50 MCG/ACT nasal spray Place 2 sprays in each nostril once daily  16 g  1  . Fluticasone-Salmeterol (ADVAIR DISKUS) 100-50 MCG/DOSE AEPB Inhale 1 puff into the lungs 2 (two) times daily.  60 each  1   No current facility-administered medications on file prior to visit.    Past Surgical History  Procedure Laterality Date  . Abdominal surgery      c-section x 2   . Cesarean section    . Tuval ligation    . Tubal ligation      No Known Allergies  History   Social History  . Marital Status: Married    Spouse Name: N/A    Number of Children: N/A  . Years of Education: N/A   Occupational History  . Not on file.   Social History Main Topics  . Smoking status: Former Smoker    Quit date: 08/17/2005  . Smokeless tobacco: Never Used  . Alcohol Use: Yes   . Drug Use: No  . Sexual Activity: Not on file   Other Topics Concern  . Not on file   Social History Narrative  . No narrative on file    Family History  Problem Relation Age of Onset  . Cancer Other     throat    BP 133/85  Pulse 80  Ht 5\' 7"  (1.702 m)  Wt 145 lb (65.772 kg)  BMI 22.71 kg/m2  LMP 03/20/2014  Review of Systems: See HPI above.    Objective:  Physical Exam:  Gen: Very uncomfortable in exam room.  Back: No gross deformity, scoliosis. TTP left low lumbar paraspinal region, buttock.  No midlin or bony TTP. Able to extend to 10 degrees, flex to 30 degrees - painful on flexion. Strength LEs 5/5 all muscle groups. 2+ MSRs in patellar and achilles tendons, equal bilaterally. Positive SLR on left, negative right. Sensation diminished left leg medial lower leg, lateral thigh. Negative logroll bilateral hips    Assessment & Plan:  1. Lumbar radiculopathy - history and exam classic for lumbar radiculopathy, likely L4 region based on her description of where the numbness wraps in her leg.  Unfortunately didn't improve with short prednisone burst, norco, skelaxin.  I don't think she would tolerate physical therapy at this time.  Will  do longer 12 day prednisone course, valium, percocet.  Concern is she has a large disc herniation that may require surgery.  Will get an MRI of lumbar spine and have her scheduled follow-up with neurosurgery on Monday.  Addendum:  MRI reviewed and discussed with patient following up on her neurosurgery visit.  She feels improved from last visit with prednisone 12 day dose pack.  Taking valium and percocet as needed.  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  She will call us as needed otherwise.

## 2014-04-06 ENCOUNTER — Ambulatory Visit: Admitting: Sports Medicine

## 2014-05-18 ENCOUNTER — Encounter: Payer: Self-pay | Admitting: Emergency Medicine

## 2014-05-18 ENCOUNTER — Emergency Department (INDEPENDENT_AMBULATORY_CARE_PROVIDER_SITE_OTHER)
Admission: EM | Admit: 2014-05-18 | Discharge: 2014-05-18 | Disposition: A | Discharge status: Home / Self Care | Source: Home / Self Care | Attending: Emergency Medicine | Admitting: Emergency Medicine

## 2014-05-18 DIAGNOSIS — B370 Candidal stomatitis: Secondary | ICD-10-CM

## 2014-05-18 HISTORY — DX: Other seasonal allergic rhinitis: J30.2

## 2014-05-18 MED ORDER — FLUCONAZOLE 150 MG PO TABS: ORAL_TABLET | ORAL | Status: DC

## 2014-05-18 NOTE — ED Provider Notes (Signed)
CSN: [id number]     Arrival date & time 05/18/14  0932 History   First MD Initiated Contact with Patient 05/18/14 1021     Chief Complaint  Patient presents with  . Oral Swelling   (Consider location/radiation/quality/duration/timing/severity/associated sxs/prior Treatment) HPI Complains of feeling of thrush in her mouth, tongue, back of throat, x6 days. This started after using Advair the past few weeks for her seasonal asthma. Currently, no wheezing and asthma controlled. She has seasonal allergies, using Zyrtec and Flonase and prescription allergy eyedrop prescribed by her eye doctor. Mild hoarseness. Otherwise, feels well without any systemic symptoms otherwise. No fever or chills or nausea or vomiting or other ENT or cardiorespiratory symptoms . Had thrush years ago after using Advair. She's been trying to rinse her mouth out with water after using Advair.  Past Medical History  Diagnosis Date  . Anxiety   . Asthma   . Seasonal allergies    Past Surgical History  Procedure Laterality Date  . Abdominal surgery      c-section x 2   . Cesarean section    . Tuval ligation    . Tubal ligation     Family History  Problem Relation Age of Onset  . Cancer Other     throat   History  Substance Use Topics  . Smoking status: Former Smoker    Quit date: 08/17/2005  . Smokeless tobacco: Never Used  . Alcohol Use: Yes   OB History   Grav Para Term Preterm Abortions TAB SAB Ect Mult Living                 Review of Systems  All other systems reviewed and are negative.   Allergies  Review of patient's allergies indicates no known allergies.  Home Medications   Prior to Admission medications   Medication Sig Start Date End Date Taking? Authorizing Provider  albuterol (PROVENTIL HFA;VENTOLIN HFA) 108 (90 BASE) MCG/ACT inhaler Inhale 2 puffs into the lungs every 4 (four) hours as needed for wheezing or shortness of breath. 09/23/13  Yes Kandra Nicolas, MD  fluticasone  Asencion Islam) 50 MCG/ACT nasal spray Place 2 sprays in each nostril once daily 09/23/13  Yes Kandra Nicolas, MD  Fluticasone-Salmeterol (ADVAIR DISKUS) 100-50 MCG/DOSE AEPB Inhale 1 puff into the lungs 2 (two) times daily. 09/23/13  Yes Kandra Nicolas, MD  Olopatadine HCl (PAZEO) 0.7 % SOLN Apply to eye.   Yes Historical Provider, MD  diazepam (VALIUM) 5 MG tablet Take 1 tablet (5 mg total) by mouth every 6 (six) hours as needed for muscle spasms. 04/02/14   Dene Gentry, MD  fluconazole (DIFLUCAN) 150 MG tablet Take 2 by mouth now, then (starting tomorrow), 1 daily for a total of 14 days treatment.-For thrush. 05/18/14   Jacqulyn Cane, MD  oxyCODONE-acetaminophen (PERCOCET) 10-325 MG per tablet Take 1 tablet by mouth every 6 (six) hours as needed for pain. 04/02/14   Dene Gentry, MD  predniSONE (STERAPRED UNI-PAK) 10 MG tablet 6 tabs po days 1-2, 5 tabs po days 3-4, 4 tabs po days 5-6, 3 tabs po days 7-8, 2 tabs po days 9-10, 1 tab po days 11-12 04/02/14   Dene Gentry, MD   BP 142/83  Pulse 64  Temp(Src) 98.1 F (36.7 C) (Oral)  Resp 16  Wt 155 lb (70.308 kg)  SpO2 100%  LMP 04/30/2014 Physical Exam  Nursing note and vitals reviewed. Constitutional: She is oriented to person, place, and time. She  appears well-developed and well-nourished. No distress.  HENT:  Head: Normocephalic and atraumatic.  ENT exam negative except whitish coating on tongue and back of throat. She is minimally hoarse. Nose with pale boggy turbinates  Eyes: Conjunctivae and EOM are normal. Pupils are equal, round, and reactive to light. No scleral icterus.  Neck: Normal range of motion.  Cardiovascular: Normal rate.   Pulmonary/Chest: Effort normal and breath sounds normal. She has no wheezes. She has no rales.  Abdominal: She exhibits no distension.  Musculoskeletal: Normal range of motion.  Lymphadenopathy:    She has no cervical adenopathy.  Neurological: She is alert and oriented to person, place, and time.   Skin: Skin is warm.  Psychiatric: She has a normal mood and affect.    ED Course  Procedures (including critical care time) Labs Review Labs Reviewed - No data to display  Imaging Review No results found.   MDM   1. Thrush    Treatment options discussed, as well as risks, benefits, alternatives. Patient voiced understanding and agreement with the following plans: Diflucan orally x14 days Other symptomatic care discussed She is holding off on using Advair and Flonase, but risks discussed. Advised her to see her allergist for followup this week for treatment plan for seasonal allergies    Jacqulyn Cane, MD 05/18/14 1026

## 2014-05-18 NOTE — ED Notes (Signed)
Pt c/o thrush in her mouth x 6 days, after using Advair. She has done salt water gargles with minimal relief.

## 2014-08-20 ENCOUNTER — Emergency Department (INDEPENDENT_AMBULATORY_CARE_PROVIDER_SITE_OTHER)
Admission: EM | Admit: 2014-08-20 | Discharge: 2014-08-20 | Disposition: A | Discharge status: Home / Self Care | Source: Home / Self Care | Attending: Emergency Medicine | Admitting: Emergency Medicine

## 2014-08-20 ENCOUNTER — Encounter: Payer: Self-pay | Admitting: *Deleted

## 2014-08-20 DIAGNOSIS — J0100 Acute maxillary sinusitis, unspecified: Secondary | ICD-10-CM

## 2014-08-20 MED ORDER — AMOXICILLIN 875 MG PO TABS
ORAL_TABLET | ORAL | Status: DC
Start: 2014-08-20 — End: 2015-03-16

## 2014-08-20 NOTE — ED Provider Notes (Signed)
CSN: [id number]     Arrival date & time 08/20/14  1009 History   First MD Initiated Contact with Patient 08/20/14 1040     Chief Complaint  Patient presents with  . Cough  . Fatigue  . Nasal Congestion   (Consider location/radiation/quality/duration/timing/severity/associated sxs/prior Treatment) HPI SINUSITIS  Onset: 3-4 days Facial/sinus pressure with discolored nasal mucus.    Severity: moderate Tried OTC meds without significant relief.  Symptoms:  + Fever  + URI prodrome with nasal congestion + Minimal swollen neck glands + mild Sinus Headache + mild ear pressure  No Allergy symptoms No significant Sore Throat No eye symptoms     No significant Cough No chest pain No shortness of breath  No wheezing  No Abdominal Pain No Nausea No Vomiting No diarrhea  No Myalgias No focal neurologic symptoms No syncope No Rash  No Urinary symptoms          Past Medical History  Diagnosis Date  . Anxiety   . Asthma   . Seasonal allergies    Past Surgical History  Procedure Laterality Date  . Abdominal surgery      c-section x 2   . Cesarean section    . Tuval ligation    . Tubal ligation     Family History  Problem Relation Age of Onset  . Cancer Other     throat   History  Substance Use Topics  . Smoking status: Former Smoker    Quit date: 08/17/2005  . Smokeless tobacco: Never Used  . Alcohol Use: Yes   OB History    No data available     Review of Systems  All other systems reviewed and are negative.   Allergies  Review of patient's allergies indicates no known allergies.  Home Medications   Prior to Admission medications   Medication Sig Start Date End Date Taking? Authorizing Provider  amoxicillin (AMOXIL) 875 MG tablet Take 1 twice a day X 10 days. 08/20/14   Jacqulyn Cane, MD  fluticasone Asencion Islam) 50 MCG/ACT nasal spray Place 2 sprays in each nostril once daily 09/23/13   Kandra Nicolas, MD  Olopatadine HCl (PAZEO) 0.7 % SOLN  Apply to eye.    Historical Provider, MD   BP 134/86 mmHg  Pulse 72  Temp(Src) 99.2 F (37.3 C) (Oral)  Resp 14  Wt 159 lb (72.122 kg)  SpO2 100% Physical Exam  Constitutional: She is oriented to person, place, and time. She appears well-developed and well-nourished. No distress.  HENT:  Head: Normocephalic and atraumatic.  Right Ear: Tympanic membrane, external ear and ear canal normal.  Left Ear: Tympanic membrane, external ear and ear canal normal.  Nose: Mucosal edema and rhinorrhea present. Right sinus exhibits maxillary sinus tenderness. Left sinus exhibits maxillary sinus tenderness.  Mouth/Throat: Oropharynx is clear and moist. No oral lesions. No oropharyngeal exudate.  Eyes: Right eye exhibits no discharge. Left eye exhibits no discharge. No scleral icterus.  Neck: Neck supple.  Cardiovascular: Normal rate, regular rhythm and normal heart sounds.   Pulmonary/Chest: Effort normal and breath sounds normal. She has no wheezes. She has no rales.  Lymphadenopathy:    She has no cervical adenopathy.  Neurological: She is alert and oriented to person, place, and time.  Skin: Skin is warm and dry.  Nursing note and vitals reviewed.   ED Course  Procedures (including critical care time) Labs Review Labs Reviewed - No data to display  Imaging Review No results found.   MDM  1. Acute maxillary sinusitis, recurrence not specified     Amoxicillin Mucinex D OTC Flonase Other symptomatic care discussed Follow-up with your primary care doctor in 5-7 days if not improving, or sooner if symptoms become worse. Precautions discussed. Red flags discussed. Questions invited and answered. Patient voiced understanding and agreement.   Jacqulyn Cane, MD 08/20/14 [phone]

## 2014-08-20 NOTE — ED Notes (Addendum)
Brylynn c/o x days of productive cough, congestion, body aches, chills and possible fever. No flu vac this season. Taken Mucinex D, Theraflu, Alkaseltzer and Sudafed otc. Also c/o mid back pain/ache with itching.

## 2014-12-26 ENCOUNTER — Emergency Department (INDEPENDENT_AMBULATORY_CARE_PROVIDER_SITE_OTHER)
Admission: EM | Admit: 2014-12-26 | Discharge: 2014-12-26 | Disposition: A | Discharge status: Home / Self Care | Source: Home / Self Care | Attending: Family Medicine | Admitting: Family Medicine

## 2014-12-26 ENCOUNTER — Encounter: Payer: Self-pay | Admitting: Emergency Medicine

## 2014-12-26 DIAGNOSIS — G43009 Migraine without aura, not intractable, without status migrainosus: Secondary | ICD-10-CM | POA: Diagnosis not present

## 2014-12-26 MED ORDER — DEXAMETHASONE SODIUM PHOSPHATE 10 MG/ML IJ SOLN
10.0000 mg | Freq: Once | INTRAMUSCULAR | Status: AC
  Administered 2014-12-26: 10 mg via INTRAMUSCULAR

## 2014-12-26 MED ORDER — METOCLOPRAMIDE HCL 5 MG/ML IJ SOLN
5.0000 mg | Freq: Once | INTRAMUSCULAR | Status: AC
  Administered 2014-12-26: 5 mg via INTRAMUSCULAR

## 2014-12-26 MED ORDER — KETOROLAC TROMETHAMINE 60 MG/2ML IM SOLN
60.0000 mg | Freq: Once | INTRAMUSCULAR | Status: AC
  Administered 2014-12-26: 60 mg via INTRAMUSCULAR

## 2014-12-26 NOTE — ED Notes (Signed)
Patient reports 48 hour history of a sinus headache; she has had migraines in past but this feels more sinus related. Has taken several OTCs without relief.

## 2014-12-26 NOTE — Discharge Instructions (Signed)
Stop Zyrtec for now.   Recurrent Migraine Headache A migraine headache is an intense, throbbing pain on one or both sides of your head. Recurrent migraines keep coming back. A migraine can last for 30 minutes to several hours. CAUSES  The exact cause of a migraine headache is not always known. However, a migraine may be caused when nerves in the brain become irritated and release chemicals that cause inflammation. This causes pain. Certain things may also trigger migraines, such as:   Alcohol.  Smoking.  Stress.  Menstruation.  Aged cheeses.  Foods or drinks that contain nitrates, glutamate, aspartame, or tyramine.  Lack of sleep.  Chocolate.  Caffeine.  Hunger.  Physical exertion.  Fatigue.  Medicines used to treat chest pain (nitroglycerine), birth control pills, estrogen, and some blood pressure medicines. SYMPTOMS   Pain on one or both sides of your head.  Pulsating or throbbing pain.  Severe pain that prevents daily activities.  Pain that is aggravated by any physical activity.  Nausea, vomiting, or both.  Dizziness.  Pain with exposure to bright lights, loud noises, or activity.  General sensitivity to bright lights, loud noises, or smells. Before you get a migraine, you may get warning signs that a migraine is coming (aura). An aura may include:  Seeing flashing lights.  Seeing bright spots, halos, or zigzag lines.  Having tunnel vision or blurred vision.  Having feelings of numbness or tingling.  Having trouble talking.  Having muscle weakness. DIAGNOSIS  A recurrent migraine headache is often diagnosed based on:  Symptoms.  Physical examination.  A CT scan or MRI of your head. These imaging tests cannot diagnose migraines but can help rule out other causes of headaches.  TREATMENT  Medicines may be given for pain and nausea. Medicines can also be given to help prevent recurrent migraines. HOME CARE INSTRUCTIONS  Only take  over-the-counter or prescription medicines for pain or discomfort as directed by your health care provider. The use of long-term narcotics is not recommended.  Lie down in a dark, quiet room when you have a migraine.  Keep a journal to find out what may trigger your migraine headaches. For example, write down:  What you eat and drink.  How much sleep you get.  Any change to your diet or medicines.  Limit alcohol consumption.  Quit smoking if you smoke.  Get 7-9 hours of sleep, or as recommended by your health care provider.  Limit stress.  Keep lights dim if bright lights bother you and make your migraines worse. SEEK MEDICAL CARE IF:   You do not get relief from the medicines given to you.  You have a recurrence of pain.  You have a fever. SEEK IMMEDIATE MEDICAL CARE IF:  Your migraine becomes severe.  You have a stiff neck.  You have loss of vision.  You have muscular weakness or loss of muscle control.  You start losing your balance or have trouble walking.  You feel faint or pass out.  You have severe symptoms that are different from your first symptoms. MAKE SURE YOU:   Understand these instructions.  Will watch your condition.  Will get help right away if you are not doing well or get worse. Document Released: 04/17/2001 Document Revised: 12/07/2013 Document Reviewed: 03/30/2013 [facility] Patient Information 2015 [place], [place]. This information is not intended to replace advice given to you by your health care provider. Make sure you discuss any questions you have with your health care provider.

## 2014-12-26 NOTE — ED Provider Notes (Signed)
CSN: [id number]     Arrival date & time 12/26/14  1619 History   First MD Initiated Contact with Patient 12/26/14 1703     Chief Complaint  Patient presents with  . Headache  . Facial Pain      HPI Comments: Patient complains of onset of a migraine headache two days ago, mild at first.  Wilburn Mylar she developed increasing pain in her right temporal area, now radiating to the left.  She has developed some sinus congestin and wonders if this could be a sinus infection.  No sore throat, cough, or fever.  No localizing neurologic signs. She states that has about two similar headaches per year, usually starting on the right side.  The headaches usually resolve with rest. Family history of migraines in her maternal grandmother, maternal uncle, and maternal aunt.  Patient is a 39 y.o. female presenting with migraines. The history is provided by the patient.  Migraine This is a recurrent problem. The current episode started 2 days ago. The problem occurs constantly. The problem has been gradually worsening. Associated symptoms include headaches. Pertinent negatives include no abdominal pain. The symptoms are aggravated by walking and exertion. Nothing relieves the symptoms. Treatments tried: Excedrin Migraine. The treatment provided no relief.    Past Medical History  Diagnosis Date  . Anxiety   . Asthma   . Seasonal allergies    Past Surgical History  Procedure Laterality Date  . Abdominal surgery      c-section x 2   . Cesarean section    . Tuval ligation    . Tubal ligation     Family History  Problem Relation Age of Onset  . Cancer Other     throat   History  Substance Use Topics  . Smoking status: Former Smoker    Quit date: 08/17/2005  . Smokeless tobacco: Never Used  . Alcohol Use: Yes   OB History    No data available     Review of Systems  Constitutional: Positive for activity change and fatigue. Negative for fever, chills and diaphoresis.  HENT: Positive for  congestion, rhinorrhea and sinus pressure. Negative for dental problem, ear pain, facial swelling, sore throat and trouble swallowing.   Eyes: Negative.   Respiratory: Negative.   Cardiovascular: Negative.   Gastrointestinal: Positive for nausea. Negative for vomiting and abdominal pain.  Genitourinary: Negative.   Musculoskeletal: Negative for myalgias, neck pain and neck stiffness.  Skin: Negative.   Neurological: Positive for syncope and headaches. Negative for dizziness, facial asymmetry, speech difficulty, light-headedness and numbness.    Allergies  Review of patient's allergies indicates no known allergies.  Home Medications   Prior to Admission medications   Medication Sig Start Date End Date Taking? Authorizing Provider  cetirizine (ZYRTEC) 10 MG chewable tablet Chew 10 mg by mouth daily.   Yes Historical Provider, MD  amoxicillin (AMOXIL) 875 MG tablet Take 1 twice a day X 10 days. 08/20/14   Jacqulyn Cane, MD  fluticasone Asencion Islam) 50 MCG/ACT nasal spray Place 2 sprays in each nostril once daily 09/23/13   Kandra Nicolas, MD  Olopatadine HCl (PAZEO) 0.7 % SOLN Apply to eye.    Historical Provider, MD   BP 113/78 mmHg  Pulse 66  Temp(Src) 97.7 F (36.5 C) (Oral)  Resp 16  Ht 5\' 7"  (1.702 m)  Wt 153 lb (69.4 kg)  BMI 23.96 kg/m2  SpO2 100%  LMP 12/24/2014 Physical Exam Nursing notes and Vital Signs reviewed. Appearance:  Patient appears  stated age, and in no acute distress Head:  No temporal artery tenderness Eyes:  Pupils are equal, round, and reactive to light and accomodation.  Extraocular movement is intact.  Conjunctivae are not inflamed.  Fundi benign.  No photophobia  Ears:  Canals normal.  Tympanic membranes normal.  Nose:  Mildly congested turbinates.  No sinus tenderness.  Pharynx:  Normal Neck:  Supple.   No adenopathy Lungs:  Clear to auscultation.  Breath sounds are equal.  Heart:  Regular rate and rhythm without murmurs, rubs, or gallops.  Abdomen:   Nontender  Skin:  No rash present.   ED Course  Procedures  none  MDM   1. Nonintractable migraine, unspecified migraine type    Administered Decadron 10mg  IM, Toradol  60mg  IM, and Reglan 5mg  IM If symptoms become significantly worse during the night or over the weekend, proceed to the local emergency room.  Followup with Family Doctor for headache management    Kandra Nicolas, MD 12/27/14 [phone]

## 2015-03-16 ENCOUNTER — Encounter: Payer: Self-pay | Admitting: Osteopathic Medicine

## 2015-03-16 ENCOUNTER — Ambulatory Visit (INDEPENDENT_AMBULATORY_CARE_PROVIDER_SITE_OTHER): Admitting: Osteopathic Medicine

## 2015-03-16 VITALS — BP 137/81 | HR 66 | Ht 67.0 in | Wt 161.0 lb

## 2015-03-16 DIAGNOSIS — Z5181 Encounter for therapeutic drug level monitoring: Secondary | ICD-10-CM

## 2015-03-16 DIAGNOSIS — R1314 Dysphagia, pharyngoesophageal phase: Secondary | ICD-10-CM | POA: Diagnosis not present

## 2015-03-16 DIAGNOSIS — Z136 Encounter for screening for cardiovascular disorders: Secondary | ICD-10-CM | POA: Diagnosis not present

## 2015-03-16 DIAGNOSIS — F329 Major depressive disorder, single episode, unspecified: Secondary | ICD-10-CM

## 2015-03-16 DIAGNOSIS — F32A Depression, unspecified: Secondary | ICD-10-CM | POA: Insufficient documentation

## 2015-03-16 DIAGNOSIS — J302 Other seasonal allergic rhinitis: Secondary | ICD-10-CM | POA: Insufficient documentation

## 2015-03-16 DIAGNOSIS — Z8249 Family history of ischemic heart disease and other diseases of the circulatory system: Secondary | ICD-10-CM

## 2015-03-16 NOTE — Patient Instructions (Addendum)
We'll place a referral to [facility] for further evaluation of your symptoms and possible scope versus imaging. If he does not hear about an appointment within the next week to call. If your symptoms change or worsen as we discussed in the office you can follow-up here or in the ER if needed. It is recommended that you see Dr. at least once per year for annual preventive care. We will get some basic blood work done today, if you not hear back about these results within a week please let us know.  If you plan to follow-up here for continuation of antidepressant medication to see back within the next month or two to see how this medicine is working you can always come sooner if you feel you need an adjustment.    Preventive Care for Adults A healthy lifestyle and preventive care can promote health and wellness. Preventive health guidelines for women include the following key practices.  A routine yearly physical is a good way to check with your health care provider about your health and preventive screening. It is a chance to share any concerns and updates on your health and to receive a thorough exam.  Visit your dentist for a routine exam and preventive care every 6 months. Brush your teeth twice a day and floss once a day. Good oral hygiene prevents tooth decay and gum disease.  The frequency of eye exams is based on your age, health, family medical history, use of contact lenses, and other factors. Follow your health care provider's recommendations for frequency of eye exams.  Eat a healthy diet. Foods like vegetables, fruits, whole grains, low-fat dairy products, and lean protein foods contain the nutrients you need without too many calories. Decrease your intake of foods high in solid fats, added sugars, and salt. Eat the right amount of calories for you.Get information about a proper diet from your health care provider, if necessary.  Regular physical exercise is one of the most important things you  can do for your health. Most adults should get at least 150 minutes of moderate-intensity exercise (any activity that increases your heart rate and causes you to sweat) each week. In addition, most adults need muscle-strengthening exercises on 2 or more days a week.  Maintain a healthy weight. The body mass index (BMI) is a screening tool to identify possible weight problems. It provides an estimate of body fat based on height and weight. Your health care provider can find your BMI and can help you achieve or maintain a healthy weight.For adults 20 years and older:  A BMI below 18.5 is considered underweight.  A BMI of 18.5 to 24.9 is normal.  A BMI of 25 to 29.9 is considered overweight.  A BMI of 30 and above is considered obese.  Maintain normal blood lipids and cholesterol levels by exercising and minimizing your intake of saturated fat. Eat a balanced diet with plenty of fruit and vegetables. Blood tests for lipids and cholesterol should begin at age 60 and be repeated every 5 years. If your lipid or cholesterol levels are high, you are over 50, or you are at high risk for heart disease, you may need your cholesterol levels checked more frequently.Ongoing high lipid and cholesterol levels should be treated with medicines if diet and exercise are not working.  If you smoke, find out from your health care provider how to quit. If you do not use tobacco, do not start.  Lung cancer screening is recommended for adults aged  55-80 years who are at high risk for developing lung cancer because of a history of smoking. A yearly low-dose CT scan of the lungs is recommended for people who have at least a 30-pack-year history of smoking and are a current smoker or have quit within the past 15 years. A pack year of smoking is smoking an average of 1 pack of cigarettes a day for 1 year (for example: 1 pack a day for 30 years or 2 packs a day for 15 years). Yearly screening should continue until the smoker  has stopped smoking for at least 15 years. Yearly screening should be stopped for people who develop a health problem that would prevent them from having lung cancer treatment.  If you are pregnant, do not drink alcohol. If you are breastfeeding, be very cautious about drinking alcohol. If you are not pregnant and choose to drink alcohol, do not have more than 1 drink per day. One drink is considered to be 12 ounces (355 mL) of beer, 5 ounces (148 mL) of wine, or 1.5 ounces (44 mL) of liquor.  Avoid use of street drugs. Do not share needles with anyone. Ask for help if you need support or instructions about stopping the use of drugs.  High blood pressure causes heart disease and increases the risk of stroke. Your blood pressure should be checked at least every 1 to 2 years. Ongoing high blood pressure should be treated with medicines if weight loss and exercise do not work.  If you are 45-68 years old, ask your health care provider if you should take aspirin to prevent strokes.  Diabetes screening involves taking a blood sample to check your fasting blood sugar level. This should be done once every 3 years, after age 23, if you are within normal weight and without risk factors for diabetes. Testing should be considered at a younger age or be carried out more frequently if you are overweight and have at least 1 risk factor for diabetes.  Breast cancer screening is essential preventive care for women. You should practice "breast self-awareness." This means understanding the normal appearance and feel of your breasts and may include breast self-examination. Any changes detected, no matter how small, should be reported to a health care provider. Women in their 52s and 30s should have a clinical breast exam (CBE) by a health care provider as part of a regular health exam every 1 to 3 years. After age 83, women should have a CBE every year. Starting at age 25, women should consider having a mammogram (breast  X-ray test) every year. Women who have a family history of breast cancer should talk to their health care provider about genetic screening. Women at a high risk of breast cancer should talk to their health care providers about having an MRI and a mammogram every year.  Breast cancer gene (BRCA)-related cancer risk assessment is recommended for women who have family members with BRCA-related cancers. BRCA-related cancers include breast, ovarian, tubal, and peritoneal cancers. Having family members with these cancers may be associated with an increased risk for harmful changes (mutations) in the breast cancer genes BRCA1 and BRCA2. Results of the assessment will determine the need for genetic counseling and BRCA1 and BRCA2 testing.  Routine pelvic exams to screen for cancer are no longer recommended for nonpregnant women who are considered low risk for cancer of the pelvic organs (ovaries, uterus, and vagina) and who do not have symptoms. Ask your health care provider if a screening  pelvic exam is right for you.  If you have had past treatment for cervical cancer or a condition that could lead to cancer, you need Pap tests and screening for cancer for at least 20 years after your treatment. If Pap tests have been discontinued, your risk factors (such as having a new sexual partner) need to be reassessed to determine if screening should be resumed. Some women have medical problems that increase the chance of getting cervical cancer. In these cases, your health care provider may recommend more frequent screening and Pap tests.  The HPV test is an additional test that may be used for cervical cancer screening. The HPV test looks for the virus that can cause the cell changes on the cervix. The cells collected during the Pap test can be tested for HPV. The HPV test could be used to screen women aged 80 years and older, and should be used in women of any age who have unclear Pap test results. After the age of 26,  women should have HPV testing at the same frequency as a Pap test.  Colorectal cancer can be detected and often prevented. Most routine colorectal cancer screening begins at the age of 17 years and continues through age 66 years. However, your health care provider may recommend screening at an earlier age if you have risk factors for colon cancer. On a yearly basis, your health care provider may provide home test kits to check for hidden blood in the stool. Use of a small camera at the end of a tube, to directly examine the colon (sigmoidoscopy or colonoscopy), can detect the earliest forms of colorectal cancer. Talk to your health care provider about this at age 55, when routine screening begins. Direct exam of the colon should be repeated every 5-10 years through age 41 years, unless early forms of pre-cancerous polyps or small growths are found.  People who are at an increased risk for hepatitis B should be screened for this virus. You are considered at high risk for hepatitis B if:  You were born in a country where hepatitis B occurs often. Talk with your health care provider about which countries are considered high risk.  Your parents were born in a high-risk country and you have not received a shot to protect against hepatitis B (hepatitis B vaccine).  You have HIV or AIDS.  You use needles to inject street drugs.  You live with, or have sex with, someone who has hepatitis B.  You get hemodialysis treatment.  You take certain medicines for conditions like cancer, organ transplantation, and autoimmune conditions.  Hepatitis C blood testing is recommended for all people born from [birth year] through 1965 and any individual with known risks for hepatitis C.  Practice safe sex. Use condoms and avoid high-risk sexual practices to reduce the spread of sexually transmitted infections (STIs). STIs include gonorrhea, chlamydia, syphilis, trichomonas, herpes, HPV, and human immunodeficiency virus (HIV).  Herpes, HIV, and HPV are viral illnesses that have no cure. They can result in disability, cancer, and death.  You should be screened for sexually transmitted illnesses (STIs) including gonorrhea and chlamydia if:  You are sexually active and are younger than 24 years.  You are older than 24 years and your health care provider tells you that you are at risk for this type of infection.  Your sexual activity has changed since you were last screened and you are at an increased risk for chlamydia or gonorrhea. Ask your health care provider if  you are at risk.  If you are at risk of being infected with HIV, it is recommended that you take a prescription medicine daily to prevent HIV infection. This is called preexposure prophylaxis (PrEP). You are considered at risk if:  You are a heterosexual woman, are sexually active, and are at increased risk for HIV infection.  You take drugs by injection.  You are sexually active with a partner who has HIV.  Talk with your health care provider about whether you are at high risk of being infected with HIV. If you choose to begin PrEP, you should first be tested for HIV. You should then be tested every 3 months for as long as you are taking PrEP.  Osteoporosis is a disease in which the bones lose minerals and strength with aging. This can result in serious bone fractures or breaks. The risk of osteoporosis can be identified using a bone density scan. Women ages 51 years and over and women at risk for fractures or osteoporosis should discuss screening with their health care providers. Ask your health care provider whether you should take a calcium supplement or vitamin D to reduce the rate of osteoporosis.  Menopause can be associated with physical symptoms and risks. Hormone replacement therapy is available to decrease symptoms and risks. You should talk to your health care provider about whether hormone replacement therapy is right for you.  Use sunscreen.  Apply sunscreen liberally and repeatedly throughout the day. You should seek shade when your shadow is shorter than you. Protect yourself by wearing long sleeves, pants, a wide-brimmed hat, and sunglasses year round, whenever you are outdoors.  Once a month, do a whole body skin exam, using a mirror to look at the skin on your back. Tell your health care provider of new moles, moles that have irregular borders, moles that are larger than a pencil eraser, or moles that have changed in shape or color.  Stay current with required vaccines (immunizations).  Influenza vaccine. All adults should be immunized every year.  Tetanus, diphtheria, and acellular pertussis (Td, Tdap) vaccine. Pregnant women should receive 1 dose of Tdap vaccine during each pregnancy. The dose should be obtained regardless of the length of time since the last dose. Immunization is preferred during the 27th-36th week of gestation. An adult who has not previously received Tdap or who does not know her vaccine status should receive 1 dose of Tdap. This initial dose should be followed by tetanus and diphtheria toxoids (Td) booster doses every 10 years. Adults with an unknown or incomplete history of completing a 3-dose immunization series with Td-containing vaccines should begin or complete a primary immunization series including a Tdap dose. Adults should receive a Td booster every 10 years.  Varicella vaccine. An adult without evidence of immunity to varicella should receive 2 doses or a second dose if she has previously received 1 dose. Pregnant females who do not have evidence of immunity should receive the first dose after pregnancy. This first dose should be obtained before leaving the health care facility. The second dose should be obtained 4-8 weeks after the first dose.  Human papillomavirus (HPV) vaccine. Females aged 13-26 years who have not received the vaccine previously should obtain the 3-dose series. The vaccine is not  recommended for use in pregnant females. However, pregnancy testing is not needed before receiving a dose. If a female is found to be pregnant after receiving a dose, no treatment is needed. In that case, the remaining doses should  be delayed until after the pregnancy. Immunization is recommended for any person with an immunocompromised condition through the age of 43 years if she did not get any or all doses earlier. During the 3-dose series, the second dose should be obtained 4-8 weeks after the first dose. The third dose should be obtained 24 weeks after the first dose and 16 weeks after the second dose.  Zoster vaccine. One dose is recommended for adults aged 87 years or older unless certain conditions are present.  Measles, mumps, and rubella (MMR) vaccine. Adults born before [birth year] generally are considered immune to measles and mumps. Adults born in [birth year] or later should have 1 or more doses of MMR vaccine unless there is a contraindication to the vaccine or there is laboratory evidence of immunity to each of the three diseases. A routine second dose of MMR vaccine should be obtained at least 28 days after the first dose for students attending postsecondary schools, health care workers, or international travelers. People who received inactivated measles vaccine or an unknown type of measles vaccine during 1963-1967 should receive 2 doses of MMR vaccine. People who received inactivated mumps vaccine or an unknown type of mumps vaccine before 1979 and are at high risk for mumps infection should consider immunization with 2 doses of MMR vaccine. For females of childbearing age, rubella immunity should be determined. If there is no evidence of immunity, females who are not pregnant should be vaccinated. If there is no evidence of immunity, females who are pregnant should delay immunization until after pregnancy. Unvaccinated health care workers born before [birth year] who lack laboratory evidence of measles, mumps, or  rubella immunity or laboratory confirmation of disease should consider measles and mumps immunization with 2 doses of MMR vaccine or rubella immunization with 1 dose of MMR vaccine.  Pneumococcal 13-valent conjugate (PCV13) vaccine. When indicated, a person who is uncertain of her immunization history and has no record of immunization should receive the PCV13 vaccine. An adult aged 29 years or older who has certain medical conditions and has not been previously immunized should receive 1 dose of PCV13 vaccine. This PCV13 should be followed with a dose of pneumococcal polysaccharide (PPSV23) vaccine. The PPSV23 vaccine dose should be obtained at least 8 weeks after the dose of PCV13 vaccine. An adult aged 24 years or older who has certain medical conditions and previously received 1 or more doses of PPSV23 vaccine should receive 1 dose of PCV13. The PCV13 vaccine dose should be obtained 1 or more years after the last PPSV23 vaccine dose.  Pneumococcal polysaccharide (PPSV23) vaccine. When PCV13 is also indicated, PCV13 should be obtained first. All adults aged 47 years and older should be immunized. An adult younger than age 71 years who has certain medical conditions should be immunized. Any person who resides in a nursing home or long-term care facility should be immunized. An adult smoker should be immunized. People with an immunocompromised condition and certain other conditions should receive both PCV13 and PPSV23 vaccines. People with human immunodeficiency virus (HIV) infection should be immunized as soon as possible after diagnosis. Immunization during chemotherapy or radiation therapy should be avoided. Routine use of PPSV23 vaccine is not recommended for American Indians, [place] Natives, or people younger than 65 years unless there are medical conditions that require PPSV23 vaccine. When indicated, people who have unknown immunization and have no record of immunization should receive PPSV23 vaccine.  One-time revaccination 5 years after the first dose of PPSV23 is recommended for people  aged 19-64 years who have chronic kidney failure, nephrotic syndrome, asplenia, or immunocompromised conditions. People who received 1-2 doses of PPSV23 before age 32 years should receive another dose of PPSV23 vaccine at age 74 years or later if at least 5 years have passed since the previous dose. Doses of PPSV23 are not needed for people immunized with PPSV23 at or after age 41 years.  Meningococcal vaccine. Adults with asplenia or persistent complement component deficiencies should receive 2 doses of quadrivalent meningococcal conjugate (MenACWY-D) vaccine. The doses should be obtained at least 2 months apart. Microbiologists working with certain meningococcal bacteria, [employer] recruits, people at risk during an outbreak, and people who travel to or live in countries with a high rate of meningitis should be immunized. A first-year college student up through age 54 years who is living in a residence hall should receive a dose if she did not receive a dose on or after her 16th birthday. Adults who have certain high-risk conditions should receive one or more doses of vaccine.  Hepatitis A vaccine. Adults who wish to be protected from this disease, have certain high-risk conditions, work with hepatitis A-infected animals, work in hepatitis A research labs, or travel to or work in countries with a high rate of hepatitis A should be immunized. Adults who were previously unvaccinated and who anticipate close contact with an international adoptee during the first 60 days after arrival in the [place] from a country with a high rate of hepatitis A should be immunized.  Hepatitis B vaccine. Adults who wish to be protected from this disease, have certain high-risk conditions, may be exposed to blood or other infectious body fluids, are household contacts or sex partners of hepatitis B positive people, are clients or workers  in certain care facilities, or travel to or work in countries with a high rate of hepatitis B should be immunized.  Haemophilus influenzae type b (Hib) vaccine. A previously unvaccinated person with asplenia or sickle cell disease or having a scheduled splenectomy should receive 1 dose of Hib vaccine. Regardless of previous immunization, a recipient of a hematopoietic stem cell transplant should receive a 3-dose series 6-12 months after her successful transplant. Hib vaccine is not recommended for adults with HIV infection. Preventive Services / Frequency Ages 40 to 44 years  Blood pressure check.** / Every 1 to 2 years.  Lipid and cholesterol check.** / Every 5 years beginning at age 50.  Clinical breast exam.** / Every 3 years for women in their 68s and 61s.  BRCA-related cancer risk assessment.** / For women who have family members with a BRCA-related cancer (breast, ovarian, tubal, or peritoneal cancers).  Pap test.** / Every 2 years from ages 54 through 71. Every 3 years starting at age 33 through age 106 or 31 with a history of 3 consecutive normal Pap tests.  HPV screening.** / Every 3 years from ages 41 through ages 57 to 80 with a history of 3 consecutive normal Pap tests.  Hepatitis C blood test.** / For any individual with known risks for hepatitis C.  Skin self-exam. / Monthly.  Influenza vaccine. / Every year.  Tetanus, diphtheria, and acellular pertussis (Tdap, Td) vaccine.** / Consult your health care provider. Pregnant women should receive 1 dose of Tdap vaccine during each pregnancy. 1 dose of Td every 10 years.  Varicella vaccine.** / Consult your health care provider. Pregnant females who do not have evidence of immunity should receive the first dose after pregnancy.  HPV vaccine. /  3 doses over 6 months, if 26 and younger. The vaccine is not recommended for use in pregnant females. However, pregnancy testing is not needed before receiving a dose.  Measles, mumps,  rubella (MMR) vaccine.** / You need at least 1 dose of MMR if you were born in 1957 or later. You may also need a 2nd dose. For females of childbearing age, rubella immunity should be determined. If there is no evidence of immunity, females who are not pregnant should be vaccinated. If there is no evidence of immunity, females who are pregnant should delay immunization until after pregnancy.  Pneumococcal 13-valent conjugate (PCV13) vaccine.** / Consult your health care provider.  Pneumococcal polysaccharide (PPSV23) vaccine.** / 1 to 2 doses if you smoke cigarettes or if you have certain conditions.  Meningococcal vaccine.** / 1 dose if you are age 28 to 62 years and a Market researcher living in a residence hall, or have one of several medical conditions, you need to get vaccinated against meningococcal disease. You may also need additional booster doses.  Hepatitis A vaccine.** / Consult your health care provider.  Hepatitis B vaccine.** / Consult your health care provider.  Haemophilus influenzae type b (Hib) vaccine.** / Consult your health care provider. Ages 36 to 72 years  Blood pressure check.** / Every 1 to 2 years.  Lipid and cholesterol check.** / Every 5 years beginning at age 25 years.  Lung cancer screening. / Every year if you are aged 20-80 years and have a 30-pack-year history of smoking and currently smoke or have quit within the past 15 years. Yearly screening is stopped once you have quit smoking for at least 15 years or develop a health problem that would prevent you from having lung cancer treatment.  Clinical breast exam.** / Every year after age 44 years.  BRCA-related cancer risk assessment.** / For women who have family members with a BRCA-related cancer (breast, ovarian, tubal, or peritoneal cancers).  Mammogram.** / Every year beginning at age 61 years and continuing for as long as you are in good health. Consult with your health care provider.  Pap  test.** / Every 3 years starting at age 48 years through age 48 or 68 years with a history of 3 consecutive normal Pap tests.  HPV screening.** / Every 3 years from ages 53 years through ages 52 to 36 years with a history of 3 consecutive normal Pap tests.  Fecal occult blood test (FOBT) of stool. / Every year beginning at age 33 years and continuing until age 19 years. You may not need to do this test if you get a colonoscopy every 10 years.  Flexible sigmoidoscopy or colonoscopy.** / Every 5 years for a flexible sigmoidoscopy or every 10 years for a colonoscopy beginning at age 65 years and continuing until age 42 years.  Hepatitis C blood test.** / For all people born from [birth year] through 1965 and any individual with known risks for hepatitis C.  Skin self-exam. / Monthly.  Influenza vaccine. / Every year.  Tetanus, diphtheria, and acellular pertussis (Tdap/Td) vaccine.** / Consult your health care provider. Pregnant women should receive 1 dose of Tdap vaccine during each pregnancy. 1 dose of Td every 10 years.  Varicella vaccine.** / Consult your health care provider. Pregnant females who do not have evidence of immunity should receive the first dose after pregnancy.  Zoster vaccine.** / 1 dose for adults aged 77 years or older.  Measles, mumps, rubella (MMR) vaccine.** / You need at least  1 dose of MMR if you were born in 1957 or later. You may also need a 2nd dose. For females of childbearing age, rubella immunity should be determined. If there is no evidence of immunity, females who are not pregnant should be vaccinated. If there is no evidence of immunity, females who are pregnant should delay immunization until after pregnancy.  Pneumococcal 13-valent conjugate (PCV13) vaccine.** / Consult your health care provider.  Pneumococcal polysaccharide (PPSV23) vaccine.** / 1 to 2 doses if you smoke cigarettes or if you have certain conditions.  Meningococcal vaccine.** / Consult your health  care provider.  Hepatitis A vaccine.** / Consult your health care provider.  Hepatitis B vaccine.** / Consult your health care provider.  Haemophilus influenzae type b (Hib) vaccine.** / Consult your health care provider. Ages 70 years and over  Blood pressure check.** / Every 1 to 2 years.  Lipid and cholesterol check.** / Every 5 years beginning at age 51 years.  Lung cancer screening. / Every year if you are aged 7-80 years and have a 30-pack-year history of smoking and currently smoke or have quit within the past 15 years. Yearly screening is stopped once you have quit smoking for at least 15 years or develop a health problem that would prevent you from having lung cancer treatment.  Clinical breast exam.** / Every year after age 43 years.  BRCA-related cancer risk assessment.** / For women who have family members with a BRCA-related cancer (breast, ovarian, tubal, or peritoneal cancers).  Mammogram.** / Every year beginning at age 39 years and continuing for as long as you are in good health. Consult with your health care provider.  Pap test.** / Every 3 years starting at age 67 years through age 36 or 24 years with 3 consecutive normal Pap tests. Testing can be stopped between 65 and 70 years with 3 consecutive normal Pap tests and no abnormal Pap or HPV tests in the past 10 years.  HPV screening.** / Every 3 years from ages 22 years through ages 77 or 55 years with a history of 3 consecutive normal Pap tests. Testing can be stopped between 65 and 70 years with 3 consecutive normal Pap tests and no abnormal Pap or HPV tests in the past 10 years.  Fecal occult blood test (FOBT) of stool. / Every year beginning at age 20 years and continuing until age 27 years. You may not need to do this test if you get a colonoscopy every 10 years.  Flexible sigmoidoscopy or colonoscopy.** / Every 5 years for a flexible sigmoidoscopy or every 10 years for a colonoscopy beginning at age 47 years and  continuing until age 19 years.  Hepatitis C blood test.** / For all people born from [birth year] through 1965 and any individual with known risks for hepatitis C.  Osteoporosis screening.** / A one-time screening for women ages 76 years and over and women at risk for fractures or osteoporosis.  Skin self-exam. / Monthly.  Influenza vaccine. / Every year.  Tetanus, diphtheria, and acellular pertussis (Tdap/Td) vaccine.** / 1 dose of Td every 10 years.  Varicella vaccine.** / Consult your health care provider.  Zoster vaccine.** / 1 dose for adults aged 54 years or older.  Pneumococcal 13-valent conjugate (PCV13) vaccine.** / Consult your health care provider.  Pneumococcal polysaccharide (PPSV23) vaccine.** / 1 dose for all adults aged 70 years and older.  Meningococcal vaccine.** / Consult your health care provider.  Hepatitis A vaccine.** / Consult your health care provider.  Hepatitis B vaccine.** / Consult your health care provider.  Haemophilus influenzae type b (Hib) vaccine.** / Consult your health care provider. ** Family history and personal history of risk and conditions may change your health care provider's recommendations. Document Released: 09/18/2001 Document Revised: 12/07/2013 Document Reviewed: 12/18/2010 [facility] Patient Information 2015 [place], [place]. This information is not intended to replace advice given to you by your health care provider. Make sure you discuss any questions you have with your health care provider.

## 2015-03-16 NOTE — Progress Notes (Signed)
Subjective:    Patient ID: Kirsten Herrera, female    DOB: 1975/09/18, 39 y.o.   MRN: [id number]  HPI  Here to establish care, tends to go to urgent care more often and would like to establish herself with a regular PCP. Other providers including OB/GYN with him she follows for her well woman care, she was recently seen there for testing for "hormone changes" told she was not in pre-menopause and her thyroid is normal, she does not have full report of the blood work that was done at that visit.   Today reports "weird thing in throat" feeling like something is getting stuck in the back of throat, points to area just above the sternal notch. Starting 3 weeks ago. Happening starting at night, but now seems to be happening more often lasting through the day. Sister is a Marine scientist. Has tried reflux medication at sister's suggestion to no avail. Fells like she has to massage her throat to swallow anything. Not painful but is bothersome. No sinus problems. Allergies well controlled. She is not having any pain with swallowing no abdominal pain  Preventative care reviewed as below     Review of Systems CONSTITUTIONAL: Neg fever/chills, no unintentional weight changes HEAD/EYES/EARS/NOSE: No headache/vision change/heraing change MOUTH/NECK: See history of present illness, reports discomfort/difficulty with swallowing. CARDIAC: No chest pain/pressure/palpitations, no orthopnea RESPIRATORY: No cough/shortness of breath/wheeze GASTROINTESTINAL: No nausea/vomiting/abdominal pain/blood in stool/diarrhea/constipation MUSCULOSKELETAL: No myalgia/arthralgia NEUROLOGICAL: Reports headaches,  GENITOURINARY: No incontinence, No abnormal genital bleeding/discharge SKIN: No rash/wounds. Reports cyst on the back of thigh which has been present for several years. HEM/ONC: No easy bruising/bleeding, no abnormal lymph node  PSYCH: Reports anxiety sleep problem and depression.    Objective:   Physical Exam   Constitutional: She is oriented to person, place, and time. She appears well-developed and well-nourished. No distress.  HENT:  Head: Normocephalic and atraumatic.  Nose: Nose normal.  Mouth/Throat: Oropharynx is clear and moist. No oropharyngeal exudate.  Eyes: Conjunctivae are normal. Right eye exhibits no discharge. Left eye exhibits no discharge. No scleral icterus.  Neck: Normal range of motion. Neck supple. No tracheal deviation present. No thyromegaly present.  Cardiovascular: Normal rate, regular rhythm and normal heart sounds.   No murmur heard. Pulmonary/Chest: Effort normal and breath sounds normal. No respiratory distress.  Abdominal: Soft. Bowel sounds are normal. There is no tenderness.  Musculoskeletal: Normal range of motion.  Lymphadenopathy:    She has no cervical adenopathy.  Neurological: She is alert and oriented to person, place, and time. No cranial nerve deficit.  Skin: Skin is warm and dry.  Small subq cyst on posterior R thigh - no erythem/drainage  Psychiatric: She has a normal mood and affect. Her behavior is normal.          Assessment & Plan:   Assessments: Dysphagia and an etiology History of Parkinson White syndrome in first-degree family member History of early cardiac death in first-degree family member Depression, controlled on Lexapro Seasonal allergies, controlled on Flonase and Zyrtec Subcutaneous cyst on posterior left leg, benign,  History of lumbar radiculopathy, not in exacerbation  Plan: We'll place referral to ear nose and throat for further evaluation of dysphagia, consult for possible scope in the office versus further imaging. Your precautions were reviewed with the patient. Low suspicion for reflux or sinus problem causing her symptoms. Patient reassured that cyst on back of leg was benign, given options were removed here in office or refer to dermatology, patient declines at this time.  Patient concerned given family history of  heart disease, EKG performed in office shows no signs of Parkinson White, precautions and chest pain, dizziness, loss of consciousness reviewed with the patient-these are her reasons to go to emergency room. Low suspicion for any problems in this patient since she has reached age 58 without significant difficulty cardiac-wise. Patient recently started on Lexapro, states she is doing well, will follow up in 1 month as she does want me to continue this medication, advised to come sooner if any problems or concerns    [place] SCREENING/COUNSELING Tobacco - QUIT 2007 Alcohol - OCCASIONAL/SOCIAL Diet - FAIR Exercise - GROUP EXERCISE AT [employer] 4 - 6 X WEEK Sexual Health/STI - NO CONCERNS Depression - PQH2 POSITIVE, TO RTC FOR FURTHER DISCUSSION - RECENTLY STARTED ON LEXAPRO DOING WELL Domestic violence - NO CONCERNS HTN - NORMAL BP Vaccination status - UTD PER PATIENT  INFECTIOUS DISEASE SCREENING HIV - all adults 15-65 GC/CT - sexually active HepC - born 59-1965  ORGANIC DISEASE SCREENING Lipid - age 76 if risk - DUE DM2 - overweight age 58-70 or other risk factors- DUE - FBG Osteoporosis - age 95+ or one sooner if risk  CANCER SCREENING Cervical - Pap q3 yr age 75+, Pap + HPV q5y age 23+ - SEES GYN Breast - Mammo age 49+ (C) and biennial age 95-75 (A) - DUE Lung - low dose CT Chest age 56-80 - NOT NEEDED Colon - age 95+ or 39 years of age prior to [place] Dx - NOT NEEDED  VACCINATION Influenza - annual - WILL GET IN FALL HPV - age <26yo Zoster - age 54+ Pneumonia - age 82+ sooner if risk (DM, other)  OTHER Fall - exercise and Vit D age 95+ Consider ASA - age 76-59

## 2015-03-18 LAB — COMPREHENSIVE METABOLIC PANEL
ALBUMIN: 4 g/dL (ref 3.6–5.1)
ALT: 14 U/L (ref 6–29)
AST: 15 U/L (ref 10–30)
Alkaline Phosphatase: 38 U/L (ref 33–115)
BUN: 16 mg/dL (ref 7–25)
CALCIUM: 9.1 mg/dL (ref 8.6–10.2)
CO2: 25 mmol/L (ref 20–31)
CREATININE: 0.75 mg/dL (ref 0.50–1.10)
Chloride: 104 mmol/L (ref 98–110)
Glucose, Bld: 81 mg/dL (ref 65–99)
Potassium: 4.1 mmol/L (ref 3.5–5.3)
SODIUM: 139 mmol/L (ref 135–146)
TOTAL PROTEIN: 6.7 g/dL (ref 6.1–8.1)
Total Bilirubin: 0.7 mg/dL (ref 0.2–1.2)

## 2015-03-18 LAB — LIPID PANEL
CHOL/HDL RATIO: 2.8 ratio (ref <=5.0)
Cholesterol: 179 mg/dL (ref 125–200)
HDL: 63 mg/dL (ref >=46)
LDL CALC: 105 mg/dL (ref <130)
Triglycerides: 54 mg/dL (ref <150)
VLDL: 11 mg/dL (ref <30)

## 2015-04-12 ENCOUNTER — Encounter: Payer: Self-pay | Admitting: Osteopathic Medicine

## 2015-04-12 DIAGNOSIS — R0989 Other specified symptoms and signs involving the circulatory and respiratory systems: Secondary | ICD-10-CM | POA: Insufficient documentation

## 2015-04-14 ENCOUNTER — Encounter: Payer: Self-pay | Admitting: Osteopathic Medicine

## 2015-04-14 ENCOUNTER — Ambulatory Visit (INDEPENDENT_AMBULATORY_CARE_PROVIDER_SITE_OTHER): Admitting: Osteopathic Medicine

## 2015-04-14 VITALS — BP 126/92 | HR 76 | Wt 165.0 lb

## 2015-04-14 DIAGNOSIS — Z113 Encounter for screening for infections with a predominantly sexual mode of transmission: Secondary | ICD-10-CM

## 2015-04-14 DIAGNOSIS — E041 Nontoxic single thyroid nodule: Secondary | ICD-10-CM

## 2015-04-14 DIAGNOSIS — N926 Irregular menstruation, unspecified: Secondary | ICD-10-CM

## 2015-04-14 DIAGNOSIS — Z5181 Encounter for therapeutic drug level monitoring: Secondary | ICD-10-CM | POA: Diagnosis not present

## 2015-04-14 DIAGNOSIS — R5382 Chronic fatigue, unspecified: Secondary | ICD-10-CM | POA: Diagnosis not present

## 2015-04-14 DIAGNOSIS — R1314 Dysphagia, pharyngoesophageal phase: Secondary | ICD-10-CM

## 2015-04-14 DIAGNOSIS — N921 Excessive and frequent menstruation with irregular cycle: Secondary | ICD-10-CM

## 2015-04-14 LAB — CBC WITH DIFFERENTIAL/PLATELET
Basophils Absolute: 0 K/uL (ref 0.0–0.1)
Basophils Relative: 0 % (ref 0–1)
Eosinophils Absolute: 0 K/uL (ref 0.0–0.7)
Eosinophils Relative: 1 % (ref 0–5)
HCT: 41.6 % (ref 36.0–46.0)
Hemoglobin: 14 g/dL (ref 12.0–15.0)
Lymphocytes Relative: 28 % (ref 12–46)
Lymphs Abs: 1.3 K/uL (ref 0.7–4.0)
MCH: 31.2 pg (ref 26.0–34.0)
MCHC: 33.7 g/dL (ref 30.0–36.0)
MCV: 92.7 fL (ref 78.0–100.0)
MPV: 9.1 fL (ref 8.6–12.4)
Monocytes Absolute: 0.5 K/uL (ref 0.1–1.0)
Monocytes Relative: 10 % (ref 3–12)
Neutro Abs: 2.8 K/uL (ref 1.7–7.7)
Neutrophils Relative %: 61 % (ref 43–77)
Platelets: 310 K/uL (ref 150–400)
RBC: 4.49 MIL/uL (ref 3.87–5.11)
RDW: 13.6 % (ref 11.5–15.5)
WBC: 4.6 K/uL (ref 4.0–10.5)

## 2015-04-14 LAB — MAGNESIUM: Magnesium: 2.1 mg/dL (ref 1.5–2.5)

## 2015-04-14 LAB — TSH: TSH: 1.539 u[IU]/mL (ref 0.350–4.500)

## 2015-04-14 NOTE — Progress Notes (Signed)
HPI: Kirsten Herrera is a 39 y.o. female who presents to [place]  today for chief complaint of: discuss results  Seen here for dysphagia, sent to ENT for scope but went to GI, they got Barium swallow and neck US, pt has questions about these results. She is still having symptoms of feeling like something is stuck in her throat, feels like she feels something there when she is swallowing, she is able to swallow food and liquids. Workup from GI included starting amitriptyline or globus sensation, thyroid ultrasound which showed small nodules, barium swallow which showed some slowed peristalsis but no mass or other abnormality.   Patient is concerned about this symptom of difficulty swallowing/sensation of foreign body in the throat.  She also has concerns about worsening fatigue, hair loss, irregular and heavy periods for the past 3 months. She was previously evaluated by her OB/GYN, states that Pap and pelvic exam was normal. She is concerned about her thyroid.     Past medical, social and family history reviewed: Past Medical History  Diagnosis Date  . Anxiety   . Asthma   . Seasonal allergies    Past Surgical History  Procedure Laterality Date  . Abdominal surgery      c-section x 2   . Cesarean section    . Tuval ligation    . Tubal ligation     Social History  Substance Use Topics  . Smoking status: Former Smoker    Quit date: 08/17/2005  . Smokeless tobacco: Never Used  . Alcohol Use: Yes   Family History  Problem Relation Age of Onset  . Cancer Other     throat  . Diabetes Other   . Stroke Other   . Parkinson's disease Father     Grandmother said Dad had [employer] Disease     Current Outpatient Prescriptions  Medication Sig Dispense Refill  . amitriptyline (ELAVIL) 25 MG tablet     . butalbital-acetaminophen-caffeine (FIORICET) 50-325-40 MG per tablet Take 2 tablets by mouth as needed for headache.    . cetirizine  (ZYRTEC) 10 MG chewable tablet Chew 10 mg by mouth daily.    Marland Kitchen escitalopram (LEXAPRO) 20 MG tablet Take 20 mg by mouth daily.    . fluticasone (FLONASE) 50 MCG/ACT nasal spray Place 2 sprays in each nostril once daily 16 g 1  . omeprazole (PRILOSEC) 40 MG capsule 2 (two) times daily.      No current facility-administered medications for this visit.   No Known Allergies   Review of Systems: CONSTITUTIONAL: Neg fever/chills, no unintentional weight changes HEAD/EYES/EARS/NOSE/THROAT: No headache/vision change or hearing change, no sore throat, (+)globus sensation/dysphagia as noted in HPI CARDIAC: No chest pain/pressure/palpitations, no orthopnea RESPIRATORY: No cough/shortness of breath/wheeze GASTROINTESTINAL: No nausea/vomiting/abdominal pain/blood in stool/diarrhea/constipation MUSCULOSKELETAL: No myalgia/arthralgia GENITOURINARY: No incontinence, No abnormal genital bleeding/discharge, (+) irregular and heavy periods SKIN: No rash/wounds/concerning lesions HEM/ONC: No easy bruising/bleeding, no abnormal lymph node ENDOCRINE: No polyuria/polydipsia/polyphagia, no heat/cold intolerance, (+) extreme fatigue as per HPI NEUROLOGIC: No weakness/dizzines/slurred speech PSYCHIATRIC:Hx depression/anxiety, seems to be getting worse, she is worried about her health, is always tired.    Exam:  BP 126/92 mmHg  Pulse 76  Wt 165 lb (74.844 kg)  SpO2 98% Constitutional: VSS, see above. General Appearance: alert, well-developed, well-nourished, NAD Eyes: Normal lids and conjunctive, non-icteric sclera, PERRLA Ears, Nose, Mouth, Throat: Normal external inspection ears/nares/mouth/lips/gums, Normal TM bilaterally, MMM, posterior pharynx without erythema/exudate Neck: No masses, trachea midline.  No thyroid enlargement/tenderness/mass appreciated Respiratory: Normal respiratory effort. No dullness/hyper-resonance to percussion. Breath sounds normal, no wheeze/rhonchi/rales Cardiovascular: S1/S2  normal, no murmur/rub/gallop auscultated. No carotid bruit or JVD. No abdominal aortic bruit. Pedal pulse II/IV bilaterally DP and PT. No lower extremity edema. Musculoskeletal: Gait normal. No clubbing/cyanosis of digits.  Neurological: No cranial nerve deficit on limited exam. Motor and sensation intact and symmetric Psychiatric: Normal judgment/insight. Normal mood and affect. Oriented x3. Vaginal exam   No results found for this or any previous visit (from the past 72 hour(s)).  Reviewed Barium Swallow and Neck US from GI (see scanned documents), reviewed specialist records  ASSESSMENT/PLAN:  Dysphagia, pharyngoesophageal phase - barium swallow and neck US neg for mass/obstruction, she has been instructed to f/u with GI, may need scope to definitely r/o esophageal pathology.   Chronic fatigue - Plan: CBC with Differential/Platelet, Magnesium, Urinalysis, Vit D  25 hydroxy (rtn osteoporosis monitoring)\ Thyroid nodule - Per [place] 04/04/15 - Plan: TSH  Medication monitoring encounter  Menstrual periods irregular - Plan: CBC with Differential/Platelet, Prolactin, Pregnancy, urine, Protime-INR, APTT  Routine screening for STI (sexually transmitted infection) - Plan: GC/chlamydia probe amp, urine, HIV antibody  Menorrhagia with irregular cycle - normal Pap/exam earlier this year after above symptoms began, may consider futher w/u treatment as warranted by results/symptoms (TVUS, hormonal contraception to regular cycle)   RTC 1 week to dicsuss results and next steps.

## 2015-04-15 LAB — URINALYSIS
Bilirubin Urine: NEGATIVE
Glucose, UA: NEGATIVE
Hgb urine dipstick: NEGATIVE
Ketones, ur: NEGATIVE
Leukocytes, UA: NEGATIVE
NITRITE: NEGATIVE
PH: 6 (ref 5.0–8.0)
Protein, ur: NEGATIVE
SPECIFIC GRAVITY, URINE: 1.006 (ref 1.001–1.035)

## 2015-04-15 LAB — PROTIME-INR
INR: 0.94 (ref <1.50)
PROTHROMBIN TIME: 12.7 s (ref 11.6–15.2)

## 2015-04-15 LAB — PREGNANCY, URINE: Preg Test, Ur: NEGATIVE

## 2015-04-15 LAB — VITAMIN D 25 HYDROXY (VIT D DEFICIENCY, FRACTURES): VIT D 25 HYDROXY: 24 ng/mL — AB (ref 30–100)

## 2015-04-15 LAB — PROLACTIN: Prolactin: 5.8 ng/mL

## 2015-04-15 LAB — HIV ANTIBODY (ROUTINE TESTING W REFLEX): HIV 1&2 Ab, 4th Generation: NONREACTIVE

## 2015-04-15 LAB — GC/CHLAMYDIA PROBE AMP
CT PROBE, AMP APTIMA: NEGATIVE
GC Probe RNA: NEGATIVE

## 2015-04-15 LAB — APTT: APTT: 27 s (ref 24–37)

## 2015-04-21 ENCOUNTER — Ambulatory Visit (INDEPENDENT_AMBULATORY_CARE_PROVIDER_SITE_OTHER): Admitting: Osteopathic Medicine

## 2015-04-21 ENCOUNTER — Encounter: Payer: Self-pay | Admitting: Osteopathic Medicine

## 2015-04-21 VITALS — BP 115/77 | HR 74 | Ht 67.0 in | Wt 168.0 lb

## 2015-04-21 DIAGNOSIS — R1314 Dysphagia, pharyngoesophageal phase: Secondary | ICD-10-CM

## 2015-04-21 DIAGNOSIS — R5382 Chronic fatigue, unspecified: Secondary | ICD-10-CM

## 2015-04-21 DIAGNOSIS — E559 Vitamin D deficiency, unspecified: Secondary | ICD-10-CM | POA: Diagnosis not present

## 2015-04-21 MED ORDER — VITAMIN D (ERGOCALCIFEROL) 1.25 MG (50000 UNIT) PO CAPS
50000.0000 [IU] | ORAL_CAPSULE | ORAL | Status: DC
Start: 2015-04-21 — End: 2015-10-26

## 2015-04-21 NOTE — Patient Instructions (Signed)
Will recheck Vitmain D levels in 8 weeks. Return to clinic if you have problems in the meantime.  Keep appointment with GI for further workup of swallowing problems.  Can see Dr Sheppard Coil as needed if symptoms are not improving or if new problems develop.

## 2015-04-21 NOTE — Progress Notes (Signed)
HPI: Kirsten Herrera is a 39 y.o. female who presents to [place]  today for chief complaint of: discuss results  Seen here for dysphagia, sent to ENT for scope which was negative but  Then went to GI, they got Barium swallow and neck US, normal. She is still having symptoms of feeling like something is stuck in her throat, feels like she feels something there when she is swallowing, she is able to swallow food and liquids but feels like it is worse than it was before. Workup from GI included starting amitriptyline or globus sensation, thyroid ultrasound which showed small nodules, barium swallow which showed some slowed peristalsis but no mass or other abnormality.   She also still has concerns about worsening fatigue, hair loss, irregular and heavy periods for the past 3 months. She was previously evaluated by her OB/GYN, states that Pap and pelvic exam was normal. She is concerned about her thyroid. All labs were normal except vitamin D low.     Past medical, social and family history reviewed: Past Medical History  Diagnosis Date  . Anxiety   . Asthma   . Seasonal allergies    Past Surgical History  Procedure Laterality Date  . Abdominal surgery      c-section x 2   . Cesarean section    . Tuval ligation    . Tubal ligation     Social History  Substance Use Topics  . Smoking status: Former Smoker    Quit date: 08/17/2005  . Smokeless tobacco: Never Used  . Alcohol Use: Yes   Family History  Problem Relation Age of Onset  . Cancer Other     throat  . Diabetes Other   . Stroke Other   . Parkinson's disease Father     Grandmother said Dad had [employer] Disease     Current Outpatient Prescriptions  Medication Sig Dispense Refill  . amitriptyline (ELAVIL) 25 MG tablet     . butalbital-acetaminophen-caffeine (FIORICET) 50-325-40 MG per tablet Take 2 tablets by mouth as needed for headache.    . cetirizine (ZYRTEC) 10 MG  chewable tablet Chew 10 mg by mouth daily.    Marland Kitchen escitalopram (LEXAPRO) 20 MG tablet Take 20 mg by mouth daily.    . fluticasone (FLONASE) 50 MCG/ACT nasal spray Place 2 sprays in each nostril once daily 16 g 1  . omeprazole (PRILOSEC) 40 MG capsule 2 (two) times daily.     . Vitamin D, Ergocalciferol, (DRISDOL) 50000 UNITS CAPS capsule Take 1 capsule (50,000 Units total) by mouth every 7 (seven) days. Take for 8 total doses(weeks) 8 capsule 0   No current facility-administered medications for this visit.   No Known Allergies   Review of Systems: CONSTITUTIONAL: Neg fever/chills, no unintentional weight changes HEAD/EYES/EARS/NOSE/THROAT: No headache/vision change or hearing change, no sore throat, (+)globus sensation/dysphagia as noted in HPI CARDIAC: No chest pain/pressure/palpitations, no orthopnea RESPIRATORY: No cough/shortness of breath/wheeze GASTROINTESTINAL: No nausea/vomiting/abdominal pain/blood in stool/diarrhea/constipation MUSCULOSKELETAL: No myalgia/arthralgia GENITOURINARY: No incontinence, No abnormal genital bleeding/discharge, (+) irregular and heavy periods SKIN: No rash/wounds/concerning lesions HEM/ONC: No easy bruising/bleeding, no abnormal lymph node ENDOCRINE: No polyuria/polydipsia/polyphagia, no heat/cold intolerance, (+) extreme fatigue as per HPI NEUROLOGIC: No weakness/dizzines/slurred speech PSYCHIATRIC:Hx depression/anxiety, seems to be getting worse, she is worried about her health, is always tired.    Exam:  BP 115/77 mmHg  Pulse 74  Ht 5\' 7"  (1.702 m)  Wt 168 lb (76.204 kg)  BMI 26.31  kg/m2 Constitutional: VSS, see above. General Appearance: alert, well-developed, well-nourished, NAD Psychiatric: Normal judgment/insight. Normal mood and affect. Oriented x3.    No results found for this or any previous visit (from the past 72 hour(s)).  Reviewed labs with patient, all questions answered. .   ASSESSMENT/PLAN:  Hypovitaminosis D - Plan: Vitamin  D, Ergocalciferol, (DRISDOL) 50000 UNITS CAPS capsule, Vit D  25 hydroxy (rtn osteoporosis monitoring) 8 weeks  Dysphagia, pharyngoesophageal phase - barium swallow and neck US neg for mass/obstruction, she has been instructed to f/u with GI, may need scope to definitely r/o esophageal pathology.   Chronic fatigue - stable, hopefully Vitmain D will help.  Note that stress/psych issue may be at the root of these symptoms, will replace Vit D and see if this helps.

## 2015-06-27 ENCOUNTER — Encounter: Payer: Self-pay | Admitting: *Deleted

## 2015-06-27 ENCOUNTER — Emergency Department (INDEPENDENT_AMBULATORY_CARE_PROVIDER_SITE_OTHER)
Admission: EM | Admit: 2015-06-27 | Discharge: 2015-06-27 | Disposition: A | Discharge status: Home / Self Care | Source: Home / Self Care | Attending: Family Medicine | Admitting: Family Medicine

## 2015-06-27 DIAGNOSIS — J040 Acute laryngitis: Secondary | ICD-10-CM | POA: Diagnosis not present

## 2015-06-27 DIAGNOSIS — J069 Acute upper respiratory infection, unspecified: Secondary | ICD-10-CM | POA: Diagnosis not present

## 2015-06-27 DIAGNOSIS — B9789 Other viral agents as the cause of diseases classified elsewhere: Principal | ICD-10-CM

## 2015-06-27 LAB — POCT RAPID STREP A (OFFICE): RAPID STREP A SCREEN: NEGATIVE

## 2015-06-27 MED ORDER — AZITHROMYCIN 250 MG PO TABS: ORAL_TABLET | ORAL | Status: DC

## 2015-06-27 MED ORDER — PREDNISONE 20 MG PO TABS: 20.0000 mg | ORAL_TABLET | Freq: Two times a day (BID) | ORAL | Status: DC

## 2015-06-27 MED ORDER — IBUPROFEN 200 MG PO TABS
200.0000 mg | ORAL_TABLET | Freq: Once | ORAL | Status: AC
  Administered 2015-06-27: 200 mg via ORAL

## 2015-06-27 NOTE — ED Notes (Signed)
Pt c/o 3 days of ear pain, sore throat and hoarseness with fatigue and night sweats.

## 2015-06-27 NOTE — ED Provider Notes (Signed)
CSN: [id number]     Arrival date & time 06/27/15  [id number] History   First MD Initiated Contact with Patient 06/27/15 1009     Chief Complaint  Patient presents with  . Sore Throat  . Hoarse     HPI Comments: Patient reports that she developed a cold about 1.5 weeks ago that seemed to resolve, but she remained fatigued.  Three days ago she became hoarse, followed by increased fatigue, increased sinus congestion with post-nasal drainage, cough, and chills.  The history is provided by the patient.    Past Medical History  Diagnosis Date  . Anxiety   . Asthma   . Seasonal allergies    Past Surgical History  Procedure Laterality Date  . Abdominal surgery      c-section x 2   . Cesarean section    . Tuval ligation    . Tubal ligation     Family History  Problem Relation Age of Onset  . Cancer Other     throat  . Diabetes Other   . Stroke Other   . Parkinson's disease Father     Grandmother said Dad had [employer] Disease    Social History  Substance Use Topics  . Smoking status: Former Smoker    Quit date: 08/17/2005  . Smokeless tobacco: Never Used  . Alcohol Use: Yes   OB History    No data available     Review of Systems + sore throat + hoarse + sneezing + cough No pleuritic pain No wheezing + nasal congestion + post-nasal drainage No sinus pain/pressure No itchy/red eyes ? earache No hemoptysis No SOB No fever, + chills No nausea No vomiting No abdominal pain No diarrhea No urinary symptoms No skin rash + fatigue No myalgias No headache Used OTC meds without relief  Review of patient's allergies indicates no known allergies.  Home Medications   Prior to Admission medications   Medication Sig Start Date End Date Taking? Authorizing Provider  amitriptyline (ELAVIL) 25 MG tablet  04/07/15   Historical Provider, MD  azithromycin (ZITHROMAX Z-PAK) 250 MG tablet Take 2 tabs today; then begin one tab once daily for 4 more days. (Rx void after  06/25/15) 06/27/15   Kandra Nicolas, MD  butalbital-acetaminophen-caffeine (FIORICET) [id number] MG per tablet Take 2 tablets by mouth as needed for headache.    Historical Provider, MD  cetirizine (ZYRTEC) 10 MG chewable tablet Chew 10 mg by mouth daily.    Historical Provider, MD  escitalopram (LEXAPRO) 20 MG tablet Take 20 mg by mouth daily.    Historical Provider, MD  fluticasone Asencion Islam) 50 MCG/ACT nasal spray Place 2 sprays in each nostril once daily 09/23/13   Kandra Nicolas, MD  omeprazole (PRILOSEC) 40 MG capsule 2 (two) times daily.  04/01/15   Historical Provider, MD  predniSONE (DELTASONE) 20 MG tablet Take 1 tablet (20 mg total) by mouth 2 (two) times daily. Take with food. 06/27/15   Kandra Nicolas, MD  Vitamin D, Ergocalciferol, (DRISDOL) 50000 UNITS CAPS capsule Take 1 capsule (50,000 Units total) by mouth every 7 (seven) days. Take for 8 total doses(weeks) 04/21/15   Emeterio Reeve, DO   Meds Ordered and Administered this Visit   Medications  ibuprofen (ADVIL,MOTRIN) tablet 200 mg (200 mg Oral Given 06/27/15 0935)    BP 145/97 mmHg  Pulse 78  Temp(Src) 98.8 F (37.1 C) (Oral)  Resp 16  Wt 165 lb (74.844 kg)  SpO2 100%  LMP 06/06/2015  No data found.   Physical Exam Nursing notes and Vital Signs reviewed.  Patient is quite hoarse and speech difficult to understand. Appearance:  Patient appears stated age, and in no acute distress Eyes:  Pupils are equal, round, and reactive to light and accomodation.  Extraocular movement is intact.  Conjunctivae are not inflamed  Ears:  Canals normal.  Tympanic membranes normal.  Nose:  Congested turbinates.  No sinus tenderness.    Pharynx:  Normal Neck:  Supple.  Tender enlarged posterior nodes are palpated bilaterally  Lungs:  Clear to auscultation.  Breath sounds are equal.  Moving air well. Chest:  Distinct tenderness to palpation over the mid-sternum.  Heart:  Regular rate and rhythm without murmurs, rubs, or gallops.   Abdomen:  Nontender without masses or hepatosplenomegaly.  Bowel sounds are present.  No CVA or flank tenderness.  Extremities:  No edema.  No calf tenderness Skin:  No rash present.   ED Course  Procedures none    Labs Reviewed  POCT RAPID STREP A (OFFICE) negative      MDM   1. Viral URI with cough   2. Laryngitis, acute    Begin prednisone burst. Take plain guaifenesin (1200mg  extended release tabs such as Mucinex) twice daily, with plenty of water, for cough and congestion.  May add Pseudoephedrine (30mg , one or two every 4 to 6 hours) for sinus congestion.  Get adequate rest.   May use Afrin nasal spray (or generic oxymetazoline) twice daily for about 5 days and then discontinue.  Also recommend using saline nasal spray several times daily and saline nasal irrigation (AYR is a common brand).   Try warm salt water gargles for sore throat.  Stop all antihistamines for now, and other non-prescription cough/cold preparations. May take Delsym Cough Suppressant at bedtime for nighttime cough.  Begin Azithromycin if not improving about one week or if persistent fever develops (Given a prescription to hold, with an expiration date)  Follow-up with family doctor if not improving about10 days.  Follow-up with ENT physician if hoarseness persists.    Kandra Nicolas, MD 06/27/15 [phone]

## 2015-06-27 NOTE — Discharge Instructions (Signed)
Take plain guaifenesin (1200mg  extended release tabs such as Mucinex) twice daily, with plenty of water, for cough and congestion.  May add Pseudoephedrine (30mg , one or two every 4 to 6 hours) for sinus congestion.  Get adequate rest.   May use Afrin nasal spray (or generic oxymetazoline) twice daily for about 5 days and then discontinue.  Also recommend using saline nasal spray several times daily and saline nasal irrigation (AYR is a common brand).   Try warm salt water gargles for sore throat.  Stop all antihistamines for now, and other non-prescription cough/cold preparations. May take Delsym Cough Suppressant at bedtime for nighttime cough.  Begin Azithromycin if not improving about one week or if persistent fever develops  Follow-up with family doctor if not improving about10 days.  Follow-up with ENT physician if hoarseness persists.

## 2015-06-29 ENCOUNTER — Telehealth: Payer: Self-pay

## 2015-07-20 ENCOUNTER — Emergency Department (INDEPENDENT_AMBULATORY_CARE_PROVIDER_SITE_OTHER)

## 2015-07-20 ENCOUNTER — Telehealth: Payer: Self-pay

## 2015-07-20 ENCOUNTER — Emergency Department (INDEPENDENT_AMBULATORY_CARE_PROVIDER_SITE_OTHER)
Admission: EM | Admit: 2015-07-20 | Discharge: 2015-07-20 | Disposition: A | Discharge status: Home / Self Care | Source: Home / Self Care | Attending: Family Medicine | Admitting: Family Medicine

## 2015-07-20 ENCOUNTER — Encounter: Payer: Self-pay | Admitting: *Deleted

## 2015-07-20 DIAGNOSIS — J069 Acute upper respiratory infection, unspecified: Secondary | ICD-10-CM | POA: Diagnosis not present

## 2015-07-20 DIAGNOSIS — R05 Cough: Secondary | ICD-10-CM

## 2015-07-20 DIAGNOSIS — B9789 Other viral agents as the cause of diseases classified elsewhere: Principal | ICD-10-CM

## 2015-07-20 MED ORDER — GUAIFENESIN-CODEINE 100-10 MG/5ML PO SOLN: ORAL | Status: DC

## 2015-07-20 MED ORDER — AMOXICILLIN 875 MG PO TABS: 875.0000 mg | ORAL_TABLET | Freq: Two times a day (BID) | ORAL | Status: DC

## 2015-07-20 NOTE — Discharge Instructions (Signed)
May continue Mucinex D with plenty of water, for cough and congestion. Get adequate rest.   May use Afrin nasal spray (or generic oxymetazoline) twice daily for about 5 days and then discontinue.  Also recommend using saline nasal spray several times daily and saline nasal irrigation (AYR is a common brand).  Use Flonase nasal spray each morning after using Afrin nasal spray and saline nasal irrigation. Try warm salt water gargles for sore throat.  Stop all antihistamines for now, and other non-prescription cough/cold preparations. May take Ibuprofen 200mg , 4 tabs every 8 hours with food for chest/sternum discomfort. Begin Amoxicillin if not improving about one week or if persistent fever develops   Follow-up with family doctor if not improving about10 days.

## 2015-07-20 NOTE — Telephone Encounter (Signed)
LEFT A MESSAGE ADVISING PATIENT TO CALL THE OFFICE BACK AND ADVISE IF SHE WANTS THE INFLUENZA VACCINE. Rhonda Cunningham,CMA

## 2015-07-20 NOTE — ED Notes (Signed)
Pt c/o 3 days of cough and hoarseness. Cough is minimally productive. No fever.

## 2015-07-20 NOTE — ED Provider Notes (Signed)
CSN: [id number]     Arrival date & time 07/20/15  [id number] History   First MD Initiated Contact with Patient 07/20/15 [phone]     Chief Complaint  Patient presents with  . Cough      HPI Comments: Patient reports that her previous URI last month resolved. She now complains of five day history of typical cold-like symptoms including mild sore throat, sinus congestion, chills, hoarseness, and fatigue.  She developed a non-productive cough 3 days ago.   The history is provided by the patient.    Past Medical History  Diagnosis Date  . Anxiety   . Asthma   . Seasonal allergies    Past Surgical History  Procedure Laterality Date  . Abdominal surgery      c-section x 2   . Cesarean section    . Tuval ligation    . Tubal ligation     Family History  Problem Relation Age of Onset  . Cancer Other     throat  . Diabetes Other   . Stroke Other   . Parkinson's disease Father     Grandmother said Dad had [employer] Disease    Social History  Substance Use Topics  . Smoking status: Former Smoker    Quit date: 08/17/2005  . Smokeless tobacco: Never Used  . Alcohol Use: Yes   OB History    No data available     Review of Systems + sore throat + hoarse + cough No pleuritic pain No wheezing + nasal congestion + post-nasal drainage No sinus pain/pressure No itchy/red eyes No earache No hemoptysis No SOB No fever, + chills + nausea + vomiting, resolved No abdominal pain + diarrhea, resolved No urinary symptoms No skin rash + fatigue No myalgias + headache Used OTC meds without relief  Allergies  Review of patient's allergies indicates no known allergies.  Home Medications   Prior to Admission medications   Medication Sig Start Date End Date Taking? Authorizing Provider  amitriptyline (ELAVIL) 25 MG tablet  04/07/15   Historical Provider, MD  amoxicillin (AMOXIL) 875 MG tablet Take 1 tablet (875 mg total) by mouth 2 (two) times daily. (Rx void after  07/28/15) 07/20/15   Kandra Nicolas, MD  butalbital-acetaminophen-caffeine (FIORICET) [phone] MG per tablet Take 2 tablets by mouth as needed for headache.    Historical Provider, MD  cetirizine (ZYRTEC) 10 MG chewable tablet Chew 10 mg by mouth daily.    Historical Provider, MD  escitalopram (LEXAPRO) 20 MG tablet Take 20 mg by mouth daily.    Historical Provider, MD  fluticasone Asencion Islam) 50 MCG/ACT nasal spray Place 2 sprays in each nostril once daily 09/23/13   Kandra Nicolas, MD  guaiFENesin-codeine 100-10 MG/5ML syrup Take 66mL by mouth at bedtime as needed for cough 07/20/15   Kandra Nicolas, MD  omeprazole (PRILOSEC) 40 MG capsule 2 (two) times daily.  04/01/15   Historical Provider, MD  Vitamin D, Ergocalciferol, (DRISDOL) 50000 UNITS CAPS capsule Take 1 capsule (50,000 Units total) by mouth every 7 (seven) days. Take for 8 total doses(weeks) 04/21/15   Emeterio Reeve, DO   Meds Ordered and Administered this Visit  Medications - No data to display  BP 149/94 mmHg  Pulse 83  Temp(Src) 97.7 F (36.5 C) (Oral)  Resp 16  Wt 161 lb (73.029 kg)  SpO2 97%  LMP 07/02/2015 No data found.   Physical Exam Nursing notes and Vital Signs reviewed. Appearance:  Patient appears stated age,  and in no acute distress Eyes:  Pupils are equal, round, and reactive to light and accomodation.  Extraocular movement is intact.  Conjunctivae are not inflamed  Ears:  Canals normal.  Tympanic membranes normal.  Nose:   Congested turbinates.  No sinus tenderness.   Pharynx:  Normal Neck:  Supple.  Tender enlarged posterior nodes are palpated bilaterally  Lungs:  Clear to auscultation.  Breath sounds are equal.  Moving air well. Chest:  Distinct tenderness to palpation over the mid-sternum. Heart:  Regular rate and rhythm without murmurs, rubs, or gallops.  Abdomen:  Nontender without masses or hepatosplenomegaly.  Bowel sounds are present.  No CVA or flank tenderness.  Extremities:  No edema.    Skin:  No rash present.   ED Course  Procedures  None      Imaging Review Dg Chest 2 View  07/20/2015  CLINICAL DATA:  Recurrent cough. EXAM: CHEST  2 VIEW COMPARISON:  August 17, 2013 FINDINGS: The heart size and mediastinal contours are within normal limits. Both lungs are clear. No pneumothorax or pleural effusion is noted. The visualized skeletal structures are unremarkable. IMPRESSION: No active cardiopulmonary disease. Electronically Signed   By: Marijo Conception, M.D.   On: 07/20/2015 10:20      MDM   1. Viral URI with cough    There is no evidence of bacterial infection today.  Treat symptomatically for now  Rx for Robitussin AC at bedtime. May continue Mucinex D with plenty of water, for cough and congestion. Get adequate rest.   May use Afrin nasal spray (or generic oxymetazoline) twice daily for about 5 days and then discontinue.  Also recommend using saline nasal spray several times daily and saline nasal irrigation (AYR is a common brand).  Use Flonase nasal spray each morning after using Afrin nasal spray and saline nasal irrigation. Try warm salt water gargles for sore throat.  Stop all antihistamines for now, and other non-prescription cough/cold preparations. May take Ibuprofen 200mg , 4 tabs every 8 hours with food for chest/sternum discomfort. Begin Amoxicillin if not improving about one week or if persistent fever develops (Given a prescription to hold, with an expiration date)  Follow-up with family doctor if not improving about10 days.     Kandra Nicolas, MD 07/20/15 [phone]

## 2015-10-20 ENCOUNTER — Encounter: Payer: Self-pay | Admitting: Family Medicine

## 2015-10-20 ENCOUNTER — Ambulatory Visit (INDEPENDENT_AMBULATORY_CARE_PROVIDER_SITE_OTHER): Admitting: Family Medicine

## 2015-10-20 VITALS — BP 116/76 | HR 82 | Ht 67.0 in | Wt 160.0 lb

## 2015-10-20 DIAGNOSIS — M5416 Radiculopathy, lumbar region: Secondary | ICD-10-CM

## 2015-10-20 MED ORDER — DIAZEPAM 5 MG PO TABS: 5.0000 mg | ORAL_TABLET | Freq: Four times a day (QID) | ORAL | Status: DC | PRN

## 2015-10-20 MED ORDER — OXYCODONE-ACETAMINOPHEN 10-325 MG PO TABS: 1.0000 | ORAL_TABLET | Freq: Four times a day (QID) | ORAL | Status: DC | PRN

## 2015-10-20 NOTE — Patient Instructions (Signed)
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 A prednisone dose pack is the best option for immediate relief and may be prescribed - continue this as given from the ED. Percocet as needed for severe pain (no driving on this medicine). Valium as needed for muscle spasms (no driving on this medicine if it makes you sleepy).   Call me Monday to let me know how you're doing.

## 2015-10-24 ENCOUNTER — Telehealth: Payer: Self-pay | Admitting: Family Medicine

## 2015-10-24 NOTE — Telephone Encounter (Signed)
Ok go ahead with lumbar spine MRI.  She has x-rays from 2015 - check if insurance requires updated ones before going ahead with mri.  Assess for central disc herniation, radiculopathy.  Thanks!

## 2015-10-26 ENCOUNTER — Telehealth: Payer: Self-pay | Admitting: Family Medicine

## 2015-10-26 ENCOUNTER — Ambulatory Visit (INDEPENDENT_AMBULATORY_CARE_PROVIDER_SITE_OTHER): Admitting: Osteopathic Medicine

## 2015-10-26 ENCOUNTER — Encounter: Payer: Self-pay | Admitting: Osteopathic Medicine

## 2015-10-26 VITALS — BP 131/89 | HR 70 | Ht 67.0 in | Wt 177.0 lb

## 2015-10-26 DIAGNOSIS — R319 Hematuria, unspecified: Secondary | ICD-10-CM

## 2015-10-26 LAB — POCT URINALYSIS DIPSTICK
Bilirubin, UA: NEGATIVE
GLUCOSE UA: NEGATIVE
Ketones, UA: NEGATIVE
Leukocytes, UA: NEGATIVE
NITRITE UA: NEGATIVE
PH UA: 7
Protein, UA: NEGATIVE
RBC UA: NEGATIVE
Spec Grav, UA: 1.01
UROBILINOGEN UA: 0.2

## 2015-10-26 LAB — POCT URINE PREGNANCY: PREG TEST UR: NEGATIVE

## 2015-10-26 NOTE — Telephone Encounter (Signed)
Order sent. Patient aware of appointment.

## 2015-10-26 NOTE — Assessment & Plan Note (Signed)
history and exam classic for lumbar radiculopathy from a central disc herniation.  She will continue with prednisone, valium and percocet for now.  If still not improving by beginning of next week would go ahead with MRI.

## 2015-10-26 NOTE — Progress Notes (Signed)
HPI: Kirsten Herrera is a 40 y.o. female who presents to [facility] today for chief complaint of:  Chief Complaint  Patient presents with  . Hematuria  .  Marland Kitchen Quality: blood in the urine, no dark/tea-colored urine, no recent strenuous exercise.  . Context: last week hurt herself at yoga, went to ER, being seen by sports med, planning for MRI. LMP last week. Bad back spasms.  . Modifying factors: recently on prednisone, painkillers for back.  . Assoc signs/symptoms: back pain but no major trauma, no saddle anesthesia, no incontinence.    Past medical, social and family history reviewed: Past Medical History  Diagnosis Date  . Anxiety   . Asthma   . Seasonal allergies    Past Surgical History  Procedure Laterality Date  . Abdominal surgery      c-section x 2   . Cesarean section    . Tuval ligation    . Tubal ligation     Social History  Substance Use Topics  . Smoking status: Former Smoker    Quit date: 08/17/2005  . Smokeless tobacco: Never Used  . Alcohol Use: 0.0 oz/week    0 Standard drinks or equivalent per week   Family History  Problem Relation Age of Onset  . Cancer Other     throat  . Diabetes Other   . Stroke Other   . Parkinson's disease Father     Grandmother said Dad had [employer] Disease     Current Outpatient Prescriptions  Medication Sig Dispense Refill  . amitriptyline (ELAVIL) 25 MG tablet     . diazepam (VALIUM) 5 MG tablet Take 1 tablet (5 mg total) by mouth every 6 (six) hours as needed for anxiety. 40 tablet 0  . escitalopram (LEXAPRO) 20 MG tablet Take 20 mg by mouth daily.    Marland Kitchen oxyCODONE-acetaminophen (PERCOCET) 10-325 MG tablet Take 1 tablet by mouth every 6 (six) hours as needed for pain. 60 tablet 0  . predniSONE (DELTASONE) 10 MG tablet      No current facility-administered medications for this visit.   No Known Allergies    Review of Systems: CONSTITUTIONAL:  No  fever, no  chills, CARDIAC: No  chest pain GASTROINTESTINAL: No  nausea, No  vomiting, No  abdominal pain, No  blood in stool, No  diarrhea, No  constipation  MUSCULOSKELETAL: (+) back myalgia/arthralgia GENITOURINARY: No  incontinence, No  abnormal vaginal bleeding/discharge, hematuria as noted in HPI, no dysuria/frequency SKIN: No  rash/wounds/concerning lesions HEM/ONC: No  easy bruising/bleeding, No  abnormal lymph node  Exam:  BP 131/89 mmHg  Pulse 70  Ht 5\' 7"  (1.702 m)  Wt 177 lb (80.287 kg)  BMI 27.72 kg/m2 Constitutional: VS see above. General Appearance: alert, well-developed, well-nourished, NAD Psychiatric: Normal judgment/insight. Normal mood and affect. Oriented x3.    Results for orders placed or performed in visit on 10/26/15 (from the past 72 hour(s))  POCT Urinalysis Dipstick     Status: None   Collection Time: 10/26/15  2:10 PM  Result Value Ref Range   Color, UA YELLOW    Clarity, UA CLEAR    Glucose, UA NEG    Bilirubin, UA NEG    Ketones, UA NEG    Spec Grav, UA 1.010    Blood, UA NEG    pH, UA 7.0    Protein, UA NEG    Urobilinogen, UA 0.2    Nitrite, UA NEG    Leukocytes, UA  Negative Negative  POCT urine pregnancy     Status: None   Collection Time: 10/26/15  2:11 PM  Result Value Ref Range   Preg Test, Ur Negative Negative     ASSESSMENT/PLAN: Will send for micro but dip is ok, possible blood dripped from hemorrhoid (pt has been constipated on pain meds), possible renal stone, no vaginal spotting, hasn't happened again, if recurs/persists will get workup, follow as needed  Blood in urine - Plan: POCT Urinalysis Dipstick, POCT urine pregnancy, Urinalysis, microscopic only   Return if symptoms worsen or fail to improve.

## 2015-10-26 NOTE — Telephone Encounter (Signed)
MRI is ordered - she should hear today about this.  However, if she is not on her period I would recommend she be seen asap by her PCP for the blood in her urine.  This would be completely unrelated to a disc herniation.

## 2015-10-26 NOTE — Progress Notes (Addendum)
Patient ID: Kirsten Herrera, female   DOB: [birth year], 40 y.o.   MRN: [id number]  PCP: Emeterio Reeve, DO  Subjective:   HPI: Patient is a 40 y.o. female here for severe low back pain.  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 During class she felt a sharp twinge on left side of low back. Some pain but able to continue with class. Over 15 minutes though and on car ride home had severe low back pain with shooting pains into left leg. Given prednisone, metaxolone, norco - only mild benefit with these. No prior issues with her back. Has numbness/tingling into left leg that wraps around to medial side of foot. No bowel/bladder dysfunction. Radiographs 8/21 showed mild OA at L5-S1, slight scoliosis. Pain is currently 10/10 and has worsened over past few days.  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 Felt a sharp pop and pain in middle of low back radiating into both legs. Pain is severe, sharp at 8/10 level still. Feels more into right leg than left leg. No numbness or tingling. Using a cane. Taking prednisone, valium, oxycodone with minimal benefit. No bowel/bladder dysfunction.  Past Medical History  Diagnosis Date  . Anxiety   . Asthma   . Seasonal allergies     Current Outpatient Prescriptions on File Prior to Visit  Medication Sig Dispense Refill  . amitriptyline (ELAVIL) 25 MG tablet     . butalbital-acetaminophen-caffeine (FIORICET) 50-325-40 MG per tablet Take 2 tablets by mouth as needed for headache.    . cetirizine (ZYRTEC) 10 MG chewable tablet Chew 10 mg by mouth daily.    Marland Kitchen escitalopram (LEXAPRO) 20 MG tablet Take 20 mg by mouth daily.    . fluticasone (FLONASE) 50 MCG/ACT nasal spray Place 2 sprays in each nostril once daily 16 g 1  . omeprazole (PRILOSEC) 40 MG capsule 2 (two) times daily.     . Vitamin D, Ergocalciferol, (DRISDOL) 50000 UNITS CAPS  capsule Take 1 capsule (50,000 Units total) by mouth every 7 (seven) days. Take for 8 total doses(weeks) 8 capsule 0   No current facility-administered medications on file prior to visit.    Past Surgical History  Procedure Laterality Date  . Abdominal surgery      c-section x 2   . Cesarean section    . Tuval ligation    . Tubal ligation      No Known Allergies  Social History   Social History  . Marital Status: Married    Spouse Name: N/A  . Number of Children: N/A  . Years of Education: N/A   Occupational History  . Not on file.   Social History Main Topics  . Smoking status: Former Smoker    Quit date: 08/17/2005  . Smokeless tobacco: Never Used  . Alcohol Use: 0.0 oz/week    0 Standard drinks or equivalent per week  . Drug Use: No  . Sexual Activity: Yes    Birth Control/ Protection: None   Other Topics Concern  . Not on file   Social History Narrative    Family History  Problem Relation Age of Onset  . Cancer Other     throat  . Diabetes Other   . Stroke Other   . Parkinson's disease Father     Grandmother said Dad had Wolf Parkinson White Disease     BP 116/76 mmHg  Pulse 82  Ht  5\' 7"  (1.702 m)  Wt 160 lb (72.576 kg)  BMI 25.05 kg/m2  Review of Systems: See HPI above.    Objective:  Physical Exam:  Gen: Very uncomfortable in exam room.  Back: No gross deformity, scoliosis. Minimal bilateral paraspinal TTP lumbar region.  No focal bony or midline tenderness. Minimal flexion and extension due to pain. Strength LEs 5/5 all muscle groups. 2+ MSRs in patellar and achilles tendons, equal bilaterally. Positive SLR bilaterally. Sensation intact to light touch currently. Negative logroll bilateral hips    Assessment & Plan:  1. 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  She will continue with prednisone, valium and percocet for now.  If still not improving by beginning of next  week would go ahead with MRI.    Addendum:  MRI reviewed and discussed with patient.  She does have a small disc bulge at L4-5 mildly progressed from last one.  Overall she feels better than when we last talked.  She would like to wait on any further treatment at this point - we discussed physical therapy, ESIs if not improving - she will call us.

## 2015-10-26 NOTE — Telephone Encounter (Signed)
Order ready

## 2015-10-27 LAB — URINALYSIS, MICROSCOPIC ONLY
BACTERIA UA: NONE SEEN [HPF]
CASTS: NONE SEEN [LPF]
Crystals: NONE SEEN [HPF]
SQUAMOUS EPITHELIAL / LPF: NONE SEEN [HPF] (ref <=5)
WBC, UA: NONE SEEN WBC/HPF (ref <=5)
YEAST: NONE SEEN [HPF]

## 2015-11-03 NOTE — Addendum Note (Signed)
Addended by: Dene Gentry on: 11/03/2015 01:58 PM   Modules accepted: Miquel Dunn

## 2015-11-04 ENCOUNTER — Encounter: Payer: Self-pay | Admitting: Family Medicine

## 2015-11-09 ENCOUNTER — Telehealth: Payer: Self-pay | Admitting: Family Medicine

## 2015-11-09 NOTE — Telephone Encounter (Signed)
I'd like to reexamine her at some point before clearing her to do this.

## 2015-11-10 ENCOUNTER — Encounter: Payer: Self-pay | Admitting: Family Medicine

## 2015-11-10 ENCOUNTER — Ambulatory Visit (INDEPENDENT_AMBULATORY_CARE_PROVIDER_SITE_OTHER): Admitting: Family Medicine

## 2015-11-10 VITALS — BP 131/95 | HR 86 | Ht 67.0 in | Wt 160.0 lb

## 2015-11-10 DIAGNOSIS — M5442 Lumbago with sciatica, left side: Secondary | ICD-10-CM

## 2015-11-10 DIAGNOSIS — M545 Low back pain: Secondary | ICD-10-CM

## 2015-11-10 DIAGNOSIS — M5441 Lumbago with sciatica, right side: Secondary | ICD-10-CM | POA: Diagnosis not present

## 2015-11-10 MED ORDER — HYDROCODONE-ACETAMINOPHEN 5-325 MG PO TABS: 1.0000 | ORAL_TABLET | Freq: Four times a day (QID) | ORAL | Status: DC | PRN

## 2015-11-10 MED ORDER — MELOXICAM 15 MG PO TABS: 15.0000 mg | ORAL_TABLET | Freq: Every day | ORAL | Status: DC

## 2015-11-10 NOTE — Patient Instructions (Signed)
You have sacroiliitis and coccydynia. Take meloxicam 15mg  daily with food for pain and inflammation. Norco as needed for severe pain. Start physical therapy - do home exercises and stretches on days you don't go to therapy. Follow up with me in 1 month to 6 weeks for reevaluation. Out of work in the meantime.

## 2015-11-11 NOTE — Assessment & Plan Note (Signed)
Initially history and exam consistent with radiculopathy.  Now her exam indicates combination of SI joint dysfunction/sacroiliitis and coccydynia.  She will start with physical therapy, meloxicam with norco as needed.  F/u in 1 month to 6 weeks.  Consider chiropractic care, injections if not improving.

## 2015-11-11 NOTE — Progress Notes (Signed)
Patient ID: Kirsten Herrera, female   DOB: 01-06-1976, 40 y.o.   MRN: [id number]  PCP: Emeterio Reeve, DO  Subjective:   HPI: Patient is a 40 y.o. female here for severe low back pain.  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 During class she felt a sharp twinge on left side of low back. Some pain but able to continue with class. Over 15 minutes though and on car ride home had severe low back pain with shooting pains into left leg. Given prednisone, metaxolone, norco - only mild benefit with these. No prior issues with her back. Has numbness/tingling into left leg that wraps around to medial side of foot. No bowel/bladder dysfunction. Radiographs 8/21 showed mild OA at L5-S1, slight scoliosis. Pain is currently 10/10 and has worsened over past few days.  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 Felt a sharp pop and pain in middle of low back radiating into both legs. Pain is severe, sharp at 8/10 level still. Feels more into right leg than left leg. No numbness or tingling. Using a cane. Taking prednisone, valium, oxycodone with minimal benefit. No bowel/bladder dysfunction.  4/6: Patient reports she's having pain at 6/10 level, sharp. Pain in low back and tailbone radiating to both hips. Difficulty turning over at night, sitting for a while. Pain also worse with motions though. Feels a click in tailbone area. No new injuries. No skin changes, fever, bowel/bladder dysfunction. 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  Past Medical History  Diagnosis Date  . Anxiety   . Asthma   . Seasonal allergies     Current Outpatient Prescriptions on File Prior to Visit  Medication Sig Dispense Refill  . amitriptyline (ELAVIL) 25 MG tablet     . escitalopram (LEXAPRO) 20 MG tablet Take 20 mg by mouth daily.     No current  facility-administered medications on file prior to visit.    Past Surgical History  Procedure Laterality Date  . Abdominal surgery      c-section x 2   . Cesarean section    . Tuval ligation    . Tubal ligation      No Known Allergies  Social History   Social History  . Marital Status: Married    Spouse Name: N/A  . Number of Children: N/A  . Years of Education: N/A   Occupational History  . Not on file.   Social History Main Topics  . Smoking status: Former Smoker    Quit date: 08/17/2005  . Smokeless tobacco: Never Used  . Alcohol Use: 0.0 oz/week    0 Standard drinks or equivalent per week  . Drug Use: No  . Sexual Activity: Yes    Birth Control/ Protection: None   Other Topics Concern  . Not on file   Social History Narrative    Family History  Problem Relation Age of Onset  . Cancer Other     throat  . Diabetes Other   . Stroke Other   . Parkinson's disease Father     Grandmother said Dad had Wolf Parkinson White Disease     BP 131/95 mmHg  Pulse 86  Ht 5\' 7"  (1.702 m)  Wt 160 lb (72.576 kg)  BMI 25.05 kg/m2  Review of Systems: See HPI above.    Objective:  Physical Exam:  Gen: Uncomfortable in exam  room.  Back: No gross deformity, scoliosis. TTP bilateral SI joints L > R.  Less tenderness sacrum, coccyx.  No other tenderness. FROM with pain on extension. Strength LEs 5/5 all muscle groups. 2+ MSRs in patellar and achilles tendons, equal bilaterally. Negative SLR bilaterally. Sensation intact to light touch currently. Negative logroll bilateral hips Fabers mild pain on left more than right.    Assessment & Plan:  1. Low back pain - Initially history and exam consistent with radiculopathy.  Now her exam indicates combination of SI joint dysfunction/sacroiliitis and coccydynia.  She will start with physical therapy, meloxicam with norco as needed.  F/u in 1 month to 6 weeks.  Consider chiropractic care, injections if not improving.

## 2015-11-23 ENCOUNTER — Ambulatory Visit (INDEPENDENT_AMBULATORY_CARE_PROVIDER_SITE_OTHER): Admitting: Rehabilitative and Restorative Service Providers"

## 2015-11-23 ENCOUNTER — Encounter: Payer: Self-pay | Admitting: Rehabilitative and Restorative Service Providers"

## 2015-11-23 DIAGNOSIS — M5416 Radiculopathy, lumbar region: Secondary | ICD-10-CM | POA: Diagnosis not present

## 2015-11-23 DIAGNOSIS — R29898 Other symptoms and signs involving the musculoskeletal system: Secondary | ICD-10-CM

## 2015-11-23 DIAGNOSIS — R293 Abnormal posture: Secondary | ICD-10-CM | POA: Diagnosis not present

## 2015-11-23 NOTE — Therapy (Signed)
[facility] [place], [place], [address] Phone: [phone]   Fax:  [phone]  Physical Therapy Evaluation  Patient Details  Name: Kirsten Herrera MRN: [id number] Date of Birth: Nov 08, 1975 Referring Provider: Dr. Karlton Lemon  Encounter Date: 11/23/2015      PT End of Session - 11/23/15 1257    Visit Number 1   Number of Visits 12   Date for PT Re-Evaluation 01/04/16   PT Start Time 1022   PT Stop Time 1119   PT Time Calculation (min) 57 min   Activity Tolerance Patient tolerated treatment well      Past Medical History  Diagnosis Date  . Anxiety   . Asthma   . Seasonal allergies     Past Surgical History  Procedure Laterality Date  . Abdominal surgery      c-section x 2   . Cesarean section    . Tuval ligation    . Tubal ligation      There were no vitals filed for this visit.       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 MRI showed HNP L4/5. Some improvement until she did a plank ~3 weeks later and felt severe pain in the hips/legs. symptoms are gradually improving.    Pertinent History ~6 yrs ago had episode of LBP; 8/15 HNP and radicular pain resolved with rest/time; fell down a flight of stairs at 40 yr old with injury to LB - resloved with therapy and time    How long can you sit comfortably? 15 min- 2 hours    How long can you stand comfortably? 15-20 min    How long can you walk comfortably? 10-15 min    Diagnostic tests MRI HNP L4/5   Patient Stated Goals exercise and learn what she can do   Currently in Pain? Yes   Pain Score 5    Pain Location Back   Pain Orientation Lower;Mid   Pain Descriptors / Indicators Sharp   Pain Type Acute pain   Pain Radiating Towards tail bone; back of Rt leg    Pain Onset More than a month ago   Pain Frequency Intermittent   Aggravating Factors  bending; lifting; cleaning; prolonged postures; any bending    Pain Relieving Factors ice; pillows under knees when lying down; pain meds             [facility] PT Assessment - 11/23/15 0001    Assessment   Medical Diagnosis Lumbar radiculopathy    Referring Provider Dr. Karlton Lemon   Onset Date/Surgical Date 10/19/15   Hand Dominance Right   Next MD Visit 5/17   Prior Therapy at 40 yo after a fall down a flight of stairs    Precautions   Precaution Comments out of work for 6 wks    Balance Screen   Has the patient fallen in the past 6 months No   Has the patient had a decrease in activity level because of a fear of falling?  No   Is the patient reluctant to leave their home because of a fear of falling?  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   Leisure Regulatory affairs officer; household chores; runner - 25 miles/wk 1/2 marathon 2/17; exercise ~6 times/wk ~1 hour/day - yoga/TRX/wt lifting    Sensation   Additional Comments episodic tingling into bilat LE's with prolonged sitting - down back of thighs stops at knees    Posture/Postural Control   Posture Comments stands with bilat knees hyperextended; incresaed lumbar lordosis; no visable assymetry with hip height or leg length in standing;    AROM   Overall AROM Comments LE mobilty - WFL's    Lumbar Flexion 90%   Lumbar Extension 75%   Lumbar - Right Side Bend 70%   Lumbar - Left Side Bend 75%   Lumbar - Right Rotation 35%   Lumbar - Left Rotation 30%   Strength   Overall Strength Comments 5/5 bilat LE's - poor core strength    Flexibility   Hamstrings tight bilat ~ 90 deg   Quadriceps WFL's   ITB WFL's   Piriformis mild tightness bilat    Palpation   Spinal mobility pain  with CPA mobs L4/5; L5/S1 and UPA mobs bilat L4/5; L5/S1; pain with palpation lateral to coccyx Lt >Rt    SI assessment  pain with palpation bilat    Palpation comment tightness through bilat QL; lumbar paraspinals; gluts laterally;   Special Tests    Special Tests --  (-) SLR; Fabers; prone knee flexion   Ambulation/Gait   Gait Comments WFL's                    [facility] Adult PT Treatment/Exercise - 11/23/15 0001    Self-Care   Self-Care --  initiated back care education    Neuro Re-ed    Neuro Re-ed Details  working on core contraction - 3 part    Lumbar Exercises: Stretches   Press Ups --  2-3 sec hold x 10    Lumbar Exercises: Supine   Other Supine Lumbar Exercises 3 part core 10 sec x 10 - difficult initially requiring verbal and tactile cues    Cryotherapy   Number Minutes Cryotherapy 20 Minutes   Cryotherapy Location Lumbar Spine   Type of Cryotherapy Ice pack   Electrical Stimulation   Electrical Stimulation Location bilat L4/5; bilat post illium    Electrical Stimulation Action IFC   Electrical Stimulation Parameters to tolerance   Electrical Stimulation Goals Pain;Tone                PT Education - 11/23/15 1059    Education provided Yes   Education Details HEP; TENS info    Person(s) Educated Patient   Methods Explanation;Demonstration;Tactile cues;Verbal cues;Handout   Comprehension Verbalized understanding;Returned demonstration;Verbal cues required;Tactile cues required          PT Short Term Goals - 11/23/15 1308    PT SHORT TERM GOAL #1   Title Improve 3 part core allowing patient to progress with appropriate core stabilization program 12/06/25   Time 2   Period Weeks   Status New   PT SHORT TERM GOAL #2   Title Improve coccyx pain by 50-75% eliminating radiating pain into this area vs referral to pelvic floor specialist if symptoms persist  12/14/15   Time 3   Period Weeks   Status New   PT SHORT TERM GOAL #3   Title Begin walking  program with pt reporting walking for 20-30 min 3-5 times/week 12/14/15   Time 3  Period Weeks   Status New   PT SHORT TERM GOAL #4   Title Improve standing posture with patient to stand without hyperextending knees 12/21/15   Time 4   Period Weeks   Status New           PT Long Term Goals - 11/23/15 1303    PT LONG TERM GOAL #1   Title Improve core stability progressing to appropriate program for home and work  01/04/16   Time 6   Period Weeks   Status New   PT LONG TERM GOAL #2   Title Instruct pt in good body mechanics and back care with pt to demo good techniques in clinic and report imporoved mechanics at home 01/04/16   Time 6   Period Weeks   Status New   PT LONG TERM GOAL #3   Title Patient reports ability to sit/stand/walk for functional activities.01/04/16   Time 6   Period Weeks   Status New   PT LONG TERM GOAL #4   Title I in HEP 01/04/16   Time 6   Period Weeks   Status New               Plan - 11/23/15 1258    Clinical Impression Statement Patient presents with recurrent LBP with bilat lumbar radiculopathy and pain in the coccyx area. She has limited trunk mobilty; pain with lumbar mobilization and palpation; radicular sympotms; limited functional activities. She will benefit from PT to address problems identified.    Rehab Potential Good   PT Frequency 2x / week   PT Duration 6 weeks   PT Treatment/Interventions Neuromuscular re-education;Patient/family education;ADLs/Self Care Home Management;Cryotherapy;Electrical Stimulation;Iontophoresis 4mg /ml Dexamethasone;Moist Heat;Ultrasound;Traction;Therapeutic activities;Therapeutic exercise;Dry needling;Manual techniques   PT Next Visit Plan core stabilization with focus on 3 part core - engaging core with any exercise and activity; address coccyx pain with manual work and modalities as indicated; progression of stabilizaon and activity level as indicated; manual work; TDN; modalities    PT Home Exercise Plan  HEP    Consulted and Agree with Plan of Care Patient      Patient will benefit from skilled therapeutic intervention in order to improve the following deficits and impairments:  Postural dysfunction, Improper body mechanics, Increased fascial restricitons, Increased muscle spasms, Pain, Decreased mobility, Decreased endurance, Decreased activity tolerance  Visit Diagnosis: Abnormal posture - Plan: PT plan of care cert/re-cert  Lumbar radiculopathy - Plan: PT plan of care cert/re-cert  Other symptoms and signs involving the musculoskeletal system - Plan: PT plan of care cert/re-cert     Problem List Patient Active Problem List   Diagnosis Date Noted  . Hypovitaminosis D 04/21/2015  . Chronic fatigue 04/21/2015  . Globus pharyngeus 04/12/2015  . Dysphagia, pharyngoesophageal phase 03/16/2015  . Depression 03/16/2015  . Seasonal allergies 03/16/2015  . Low back pain 04/05/2014    Kirsten Herrera Nilda Simmer PT, MPH  11/23/2015, 1:16 PM  [facility] [address] [place], [place], [address] Phone: [phone]   Fax:  [phone]  Name: Kirsten Herrera MRN: [id number] Date of Birth: 02/01/1976

## 2015-11-23 NOTE — Patient Instructions (Addendum)
Abdominal Bracing With Pelvic Floor (Hook-Lying)    With neutral spine, tighten pelvic floor and abdominals sucking belly button to back bone; tighten muscles in back at waist. Hold 10 sec  Repeat _10__ times. Do _several __ times a day. Progress to do this in sitting; standing; walking and with functional activities   Trunk: Prone Extension (Press-Ups)    Lie on stomach on firm, flat surface. Relax bottom and legs. Raise chest in air with elbows straight. Keep hips flat on surface, sag stomach. Hold __2-3__ seconds. Repeat _10___ times. Do _3-4___ sessions per day. CAUTION: Movement should be gentle and slow.  Sleeping on Back  Place pillow under knees. A pillow with cervical support and a roll around waist are also helpful. Copyright  VHI. All rights reserved.  Sleeping on Side Place pillow between knees. Use cervical support under neck and a roll around waist as needed. Copyright  VHI. All rights reserved.   Sleeping on Stomach   If this is the only desirable sleeping position, place pillow under lower legs, and under stomach or chest as needed.  Posture - Sitting   Sit upright, head facing forward. Try using a roll to support lower back. Keep shoulders relaxed, and avoid rounded back. Keep hips level with knees. Avoid crossing legs for long periods. Stand to Sit / Sit to Stand   To sit: Bend knees to lower self onto front edge of chair, then scoot back on seat. To stand: Reverse sequence by placing one foot forward, and scoot to front of seat. Use rocking motion to stand up.   Work Height and Reach  Ideal work height is no more than 2 to 4 inches below elbow level when standing, and at elbow level when sitting. Reaching should be limited to arm's length, with elbows slightly bent.  Bending  Bend at hips and knees, not back. Keep feet shoulder-width apart.    Posture - Standing   Good posture is important. Avoid slouching and forward head thrust. Maintain curve  in low back and align ears over shoul- ders, hips over ankles.  Alternating Positions   Alternate tasks and change positions frequently to reduce fatigue and muscle tension. Take rest breaks. Computer Work   Position work to Programmer, multimedia. Use proper work and seat height. Keep shoulders back and down, wrists straight, and elbows at right angles. Use chair that provides full back support. Add footrest and lumbar roll as needed.  Getting Into / Out of Car  Lower self onto seat, scoot back, then bring in one leg at a time. Reverse sequence to get out.  Dressing  Lie on back to pull socks or slacks over feet, or sit and bend leg while keeping back straight.    Housework - Sink  Place one foot on ledge of cabinet under sink when standing at sink for prolonged periods.   Pushing / Pulling  Pushing is preferable to pulling. Keep back in proper alignment, and use leg muscles to do the work.  Deep Squat   Squat and lift with both arms held against upper trunk. Tighten stomach muscles without holding breath. Use smooth movements to avoid jerking.  Avoid Twisting   Avoid twisting or bending back. Pivot around using foot movements, and bend at knees if needed when reaching for articles.  Carrying Luggage   Distribute weight evenly on both sides. Use a cart whenever possible. Do not twist trunk. Move body as a unit.   Lifting Principles .Maintain proper posture and  head alignment. .Slide object as close as possible before lifting. .Move obstacles out of the way. .Test before lifting; ask for help if too heavy. .Tighten stomach muscles without holding breath. .Use smooth movements; do not jerk. .Use legs to do the work, and pivot with feet. .Distribute the work load symmetrically and close to the center of trunk. .Push instead of pull whenever possible.   Ask For Help   Ask for help and delegate to others when possible. Coordinate your movements when lifting together, and  maintain the low back curve.  Log Roll   Lying on back, bend left knee and place left arm across chest. Roll all in one movement to the right. Reverse to roll to the left. Always move as one unit. Housework - Sweeping  Use long-handled equipment to avoid stooping.   Housework - Wiping  Position yourself as close as possible to reach work surface. Avoid straining your back.  Laundry - Unloading Wash   To unload small items at bottom of washer, lift leg opposite to arm being used to reach.  [place] close to area to be raked. Use arm movements to do the work. Keep back straight and avoid twisting.     Cart  When reaching into cart with one arm, lift opposite leg to keep back straight.   Getting Into / Out of Bed  Lower self to lie down on one side by raising legs and lowering head at the same time. Use arms to assist moving without twisting. Bend both knees to roll onto back if desired. To sit up, start from lying on side, and use same move-ments in reverse. Housework - Vacuuming  Hold the vacuum with arm held at side. Step back and forth to move it, keeping head up. Avoid twisting.   Laundry - IT consultant so that bending and twisting can be avoided.   Laundry - Unloading Dryer  Squat down to reach into clothes dryer or use a reacher.  Gardening - Weeding / Probation officer or Kneel. Knee pads may be helpful.

## 2015-11-25 ENCOUNTER — Ambulatory Visit (INDEPENDENT_AMBULATORY_CARE_PROVIDER_SITE_OTHER): Admitting: Physical Therapy

## 2015-11-25 DIAGNOSIS — R293 Abnormal posture: Secondary | ICD-10-CM | POA: Diagnosis not present

## 2015-11-25 DIAGNOSIS — R29898 Other symptoms and signs involving the musculoskeletal system: Secondary | ICD-10-CM

## 2015-11-25 DIAGNOSIS — M5416 Radiculopathy, lumbar region: Secondary | ICD-10-CM | POA: Diagnosis not present

## 2015-11-25 NOTE — Patient Instructions (Addendum)
  Abdominal Bracing With Pelvic Floor (Hook-Lying)   With neutral spine, tighten pelvic floor and abdominals. Hold 10 seconds. Repeat __10_ times. Do _1__ times a day.   Knee to Chest: Transverse Plane Stability   Bring one knee up, then return. Be sure pelvis does not roll side to side. Keep pelvis still. Lift knee __10_ times each leg. Restabilize pelvis. Repeat with other leg. Do _1-2__ sets, _1__ times per day.  Can add arms (dead bug) once the above is mastered.   Hip External Rotation With Pillow: Transverse Plane Stability   KEEP BOTH KNEES BENT. Slowly roll bent knee out. Be sure pelvis does not rotate. Do _10__ times. Restabilize pelvis. Repeat with other leg. Do _1-2__ sets, _1__ times per day.  Heel Slide: 4-10 Inches - Transverse Plane Stability   Slide heel 4 inches down. Be sure pelvis does not rotate. Do _10__ times. Restabilize pelvis. Repeat with other leg. Do __1_ sets, _1__ times per day.  On Elbows (Prone)    Rise up on elbows as high as possible, keeping hips on floor. Hold __10__ seconds. Repeat __5__ times per set. Do __1__ sets per session. Do __2__ sessions per day.  http://orth.exer.us/93  Trunk Extension    Standing, place back of open hands on low back. Straighten spine then arch the back and move shoulders back. Repeat _5__ times per session. Hold 3 sec.  Perform this any time getting up from sewing.  Variation: Seated   [facility] [place] [address] [place], [place] [address]  [phone] (office) [phone] (fax)

## 2015-11-25 NOTE — Therapy (Signed)
[place], [place], [address] Phone: [phone]   Fax:  [phone]  Physical Therapy Treatment  Patient Details  Name: Kirsten Herrera MRN: [id number] Date of Birth: [birth year] Referring Provider: Dr. Barbaraann Barthel  Encounter Date: 11/25/2015      PT End of Session - 11/25/15 1341    Visit Number 2   Number of Visits 12   Date for PT Re-Evaluation 01/04/16   PT Start Time 1330   PT Stop Time 1411   PT Time Calculation (min) 41 min   Activity Tolerance Patient tolerated treatment well;No increased pain      Past Medical History  Diagnosis Date  . Anxiety   . Asthma   . Seasonal allergies     Past Surgical History  Procedure Laterality Date  . Abdominal surgery      c-section x 2   . Cesarean section    . Tuval ligation    . Tubal ligation      There were no vitals filed for this visit.      Subjective Assessment - 11/25/15 1342    Subjective Pt reports she has very little to no pain since last visit. Pt reports she has performing HEP and modifying body mechanics.    Currently in Pain? No/denies            [facility] PT Assessment - 11/25/15 0001    Assessment   Medical Diagnosis Lumbar radiculopathy    Referring Provider Dr. Barbaraann Barthel   Onset Date/Surgical Date 10/19/15   Hand Dominance Right   Next MD Visit 5/17          [facility] Adult PT Treatment/Exercise - 11/25/15 0001    Lumbar Exercises: Standing   Other Standing Lumbar Exercises standing trunk ext x 3 sec hold x 3 reps    Lumbar Exercises: Supine   Ab Set 10 reps;5 seconds   Clam 5 reps;1 second  each leg, with ab set x 2 sets   Heel Slides 10 reps  each leg, with ab set   Bent Knee Raise 10 reps  with ab set, each leg   Other Supine Lumbar Exercises Hooklying position - core engaged with alternating shoulder flexion x 10; dead bug with core engaged x 10    Other Supine Lumbar Exercises supine to sit x 2 reps via  log roll (to review good body mechanics.)   Lumbar Exercises: Prone   Other Prone Lumbar Exercises prone press up x 3 sec hold x 5 rep - VC to relax LE and buttocks.  Switched to POE x 5 sec x 5 reps (pt reported greater ease)   Modalities   Modalities --  pt declined; painfree at end of session          PT Short Term Goals - 11/23/15 1308    PT SHORT TERM GOAL #1   Title Improve 3 part core allowing patient to progress with appropriate core stabilization program 12/06/25   Time 2   Period Weeks   Status New   PT SHORT TERM GOAL #2   Title Improve coccyx pain by 50-75% eliminating radiating pain into this area vs referral to pelvic floor specialist if symptoms persist  12/14/15   Time 3   Period Weeks   Status New   PT SHORT TERM GOAL #3   Title Begin walking program with pt reporting walking for 20-30 min 3-5 times/week 12/14/15   Time 3   Period Weeks  Status New   PT SHORT TERM GOAL #4   Title Improve standing posture with patient to stand without hyperextending knees 12/21/15   Time 4   Period Weeks   Status New           PT Long Term Goals - 11/25/15 1341    PT LONG TERM GOAL #1   Title Improve core stability progressing to appropriate program for home and work  01/04/16   Time 6   Period Weeks   Status On-going   PT LONG TERM GOAL #2   Title Instruct pt in good body mechanics and back care with pt to demo good techniques in clinic and report imporoved mechanics at home 01/04/16   Time 6   Period Weeks   Status On-going   PT LONG TERM GOAL #3   Title Patient reports ability to sit/stand/walk for functional activities.01/04/16   Time 6   Period Weeks   Status On-going   PT LONG TERM GOAL #4   Title I in HEP 01/04/16   Time 6   Period Weeks   Status On-going               Plan - 11/25/15 1354    Clinical Impression Statement Pt demonstrated improved TA contraction this visit; tolerated all new exercises without production of pain.  Pt declined use of  modalities as she had no pain at end of session.  Pt progressing towards goals.    Rehab Potential Good   PT Frequency 2x / week   PT Duration 6 weeks   PT Treatment/Interventions Neuromuscular re-education;Patient/family education;ADLs/Self Care Home Management;Cryotherapy;Electrical Stimulation;Iontophoresis 4mg /ml Dexamethasone;Moist Heat;Ultrasound;Traction;Therapeutic activities;Therapeutic exercise;Dry needling;Manual techniques   PT Next Visit Plan Continue core stabilization, engaging core with exercise and activity.  Address coccyx pain with manual work and modalities as indicated. Modalities as indicated.    PT Home Exercise Plan HEP    Consulted and Agree with Plan of Care Patient      Patient will benefit from skilled therapeutic intervention in order to improve the following deficits and impairments:  Postural dysfunction, Improper body mechanics, Increased fascial restricitons, Increased muscle spasms, Pain, Decreased mobility, Decreased endurance, Decreased activity tolerance  Visit Diagnosis: Abnormal posture  Lumbar radiculopathy  Other symptoms and signs involving the musculoskeletal system     Problem List Patient Active Problem List   Diagnosis Date Noted  . Hypovitaminosis D 04/21/2015  . Chronic fatigue 04/21/2015  . Globus pharyngeus 04/12/2015  . Dysphagia, pharyngoesophageal phase 03/16/2015  . Depression 03/16/2015  . Seasonal allergies 03/16/2015  . Low back pain 04/05/2014   Kerin Perna, PTA 11/25/2015 3:58 PM   Celyn P. Helene Kelp PT, MPH 11/25/2015 4:14 PM   [facility] [place], [place], [address] Phone: [phone]   Fax:  [phone]  Name: Kirsten Herrera MRN: [id number] Date of Birth: 1975-08-24

## 2015-11-28 ENCOUNTER — Encounter: Payer: Self-pay | Admitting: Rehabilitative and Restorative Service Providers"

## 2015-11-28 ENCOUNTER — Ambulatory Visit (INDEPENDENT_AMBULATORY_CARE_PROVIDER_SITE_OTHER): Admitting: Rehabilitative and Restorative Service Providers"

## 2015-11-28 DIAGNOSIS — R29898 Other symptoms and signs involving the musculoskeletal system: Secondary | ICD-10-CM

## 2015-11-28 DIAGNOSIS — M5416 Radiculopathy, lumbar region: Secondary | ICD-10-CM | POA: Diagnosis not present

## 2015-11-28 DIAGNOSIS — R293 Abnormal posture: Secondary | ICD-10-CM

## 2015-11-28 NOTE — Patient Instructions (Addendum)
Trigger Point Dry Needling  . What is Trigger Point Dry Needling (DN)? o DN is a physical therapy technique used to treat muscle pain and dysfunction. Specifically, DN helps deactivate muscle trigger points (muscle knots).  o A thin filiform needle is used to penetrate the skin and stimulate the underlying trigger point. The goal is for a local twitch response (LTR) to occur and for the trigger point to relax. No medication of any kind is injected during the procedure.   . What Does Trigger Point Dry Needling Feel Like?  o The procedure feels different for each individual patient. Some patients report that they do not actually feel the needle enter the skin and overall the process is not painful. Very mild bleeding may occur. However, many patients feel a deep cramping in the muscle in which the needle was inserted. This is the local twitch response.   Marland Kitchen How Will I feel after the treatment? o Soreness is normal, and the onset of soreness may not occur for a few hours. Typically this soreness does not last longer than two days.  o Bruising is uncommon, however; ice can be used to decrease any possible bruising.  o In rare cases feeling tired or nauseous after the treatment is normal. In addition, your symptoms may get worse before they get better, this period will typically not last longer than 24 hours.   . What Can I do After My Treatment? o Increase your hydration by drinking more water for the next 24 hours. o You may place ice or heat on the areas treated that have become sore, however, do not use heat on inflamed or bruised areas. Heat often brings more relief post needling. o You can continue your regular activities, but vigorous activity is not recommended initially after the treatment for 24 hours. o DN is best combined with other physical therapy such as strengthening, stretching, and other therapies.   Achilles / Gastroc, Standing    Stand, right foot behind, heel on floor and  turned slightly out, leg straight, forward leg bent. Move hips forward. Hold __30_ seconds. Repeat _3__ times per session. Do _1-2__ sessions per day.   Achilles / Soleus, Standing    Stand, right foot behind, heel on floor and turned slightly out. Lower hips and bend knees. Hold _30__ seconds. Repeat _3__ times per session. Do _1-2__ sessions per day.   Squats - x 10 core engaged   Stretch for buttocks pulling out to side 10-20 sec hold 3-4 reps
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
## 2015-11-30 ENCOUNTER — Ambulatory Visit (INDEPENDENT_AMBULATORY_CARE_PROVIDER_SITE_OTHER): Admitting: Rehabilitative and Restorative Service Providers"

## 2015-11-30 ENCOUNTER — Encounter: Payer: Self-pay | Admitting: Rehabilitative and Restorative Service Providers"

## 2015-11-30 DIAGNOSIS — R293 Abnormal posture: Secondary | ICD-10-CM

## 2015-11-30 DIAGNOSIS — R29898 Other symptoms and signs involving the musculoskeletal system: Secondary | ICD-10-CM | POA: Diagnosis not present

## 2015-11-30 DIAGNOSIS — M5416 Radiculopathy, lumbar region: Secondary | ICD-10-CM

## 2015-11-30 NOTE — Therapy (Signed)
[place], [place], [address] Phone: [phone]   Fax:  [phone]  Physical Therapy Treatment  Patient Details  Name: Kirsten Herrera MRN: [id number] Date of Birth: Feb 27, 1976 Referring Provider: Dr. Barbaraann Barthel  Encounter Date: 11/30/2015      PT End of Session - 11/30/15 0938    Visit Number 4   Number of Visits 12   Date for PT Re-Evaluation 01/04/16   PT Start Time 0933   PT Stop Time 1030   PT Time Calculation (min) 57 min   Activity Tolerance Patient tolerated treatment well      Past Medical History  Diagnosis Date  . Anxiety   . Asthma   . Seasonal allergies     Past Surgical History  Procedure Laterality Date  . Abdominal surgery      c-section x 2   . Cesarean section    . Tuval ligation    . Tubal ligation      There were no vitals filed for this visit.      Subjective Assessment - 11/30/15 0939    Subjective Patient reports that she was very sore after TDN. Stayed on heat that evening. Much better yesterday. Had a great day. Does have increased radicular pain today and she is not sure what that is about. May be related to stress.   Currently in Pain? Yes   Pain Score 4    Pain Location Back   Pain Orientation Lower;Mid   Pain Descriptors / Indicators Dull;Aching   Pain Type Acute pain   Pain Onset More than a month ago   Pain Frequency Intermittent                         [facility] Adult PT Treatment/Exercise - 11/30/15 0001    Lumbar Exercises: Stretches   Press Ups --  2-3 sec x 5   Lumbar Exercises: Aerobic   Stationary Bike Nu step L6 x 5 min arms and legs    Lumbar Exercises: Supine   Clam 5 reps;1 second   Heel Slides 10 reps   Bent Knee Raise 10 reps   Other Supine Lumbar Exercises Hooklying position - core engaged with alternating shoulder flexion x 10; dead bug with core engaged x 10    Other Supine Lumbar Exercises 3 part core 10 sec  x 10    Lumbar Exercises: Prone   Other Prone Lumbar Exercises pelvic press 5 sec x 10    Other Prone Lumbar Exercises stretch for buttock/coccyx 20 sec x 3    Cryotherapy   Number Minutes Cryotherapy 20 Minutes   Cryotherapy Location Lumbar Spine   Type of Cryotherapy Ice pack   Electrical Stimulation   Electrical Stimulation Location bilat L4/5; Rt L3/4; Rt hip    Electrical Stimulation Action IFC   Electrical Stimulation Parameters to tolerance   Electrical Stimulation Goals Pain;Tone   Manual Therapy   Manual therapy comments pt prone    Soft tissue mobilization working through lumbar spine musculature; also work along lateral borders of coccyx - Lt tighter and more tender than Rt    Myofascial Release lumbar spine                 PT Education - 11/30/15 0943    Education provided Yes   Education Details HEP   Person(s) Educated Patient   Methods Explanation;Tactile cues;Verbal cues;Handout   Comprehension Verbalized understanding;Returned demonstration;Verbal cues  required;Tactile cues required;Need further instruction          PT Short Term Goals - 11/28/15 1219    PT SHORT TERM GOAL #1   Title Improve 3 part core allowing patient to progress with appropriate core stabilization program 12/06/25   Time 2   Period Weeks   Status On-going   PT SHORT TERM GOAL #2   Title Improve coccyx pain by 50-75% eliminating radiating pain into this area vs referral to pelvic floor specialist if symptoms persist  12/14/15   Time 3   Period Weeks   Status On-going   PT SHORT TERM GOAL #3   Title Begin walking program with pt reporting walking for 20-30 min 3-5 times/week 12/14/15   Time 3   Period Weeks   Status On-going   PT SHORT TERM GOAL #4   Title Improve standing posture with patient to stand without hyperextending knees 12/21/15   Time 4   Period Weeks   Status On-going           PT Long Term Goals - 11/28/15 1222    PT LONG TERM GOAL #1   Title Improve core  stability progressing to appropriate program for home and work  01/04/16   Time 6   Period Weeks   Status On-going   PT LONG TERM GOAL #2   Title Instruct pt in good body mechanics and back care with pt to demo good techniques in clinic and report imporoved mechanics at home 01/04/16   Time 6   Period Weeks   Status On-going   PT LONG TERM GOAL #3   Title Patient reports ability to sit/stand/walk for functional activities.01/04/16   Time 6   Period Weeks   Status On-going   PT LONG TERM GOAL #4   Title I in HEP 01/04/16   Time 6   Period Weeks   Status On-going               Plan - 11/30/15 1018    Clinical Impression Statement Increased pain today with some radicular symptoms into bilat back/hip area. She had a good response to TDN and manual work through the coccyx area and LB. Continued symptoms - reactive to activity level and possibly weather. History of fibromyalgia. May benefit form additional TDN next week.    Rehab Potential Good   PT Frequency 2x / week   PT Duration 6 weeks   PT Treatment/Interventions Neuromuscular re-education;Patient/family education;ADLs/Self Care Home Management;Cryotherapy;Electrical Stimulation;Iontophoresis 4mg /ml Dexamethasone;Moist Heat;Ultrasound;Traction;Therapeutic activities;Therapeutic exercise;Dry needling;Manual techniques   PT Next Visit Plan Continue core stabilization, engaging core with exercise and activity.  Address coccyx pain with manual work and modalities as indicated. Modalities as indicated. TDN    PT Home Exercise Plan HEP    Consulted and Agree with Plan of Care Patient      Patient will benefit from skilled therapeutic intervention in order to improve the following deficits and impairments:  Postural dysfunction, Improper body mechanics, Increased fascial restricitons, Increased muscle spasms, Pain, Decreased mobility, Decreased endurance, Decreased activity tolerance  Visit Diagnosis: Abnormal posture  Lumbar  radiculopathy  Other symptoms and signs involving the musculoskeletal system     Problem List Patient Active Problem List   Diagnosis Date Noted  . Hypovitaminosis D 04/21/2015  . Chronic fatigue 04/21/2015  . Globus pharyngeus 04/12/2015  . Dysphagia, pharyngoesophageal phase 03/16/2015  . Depression 03/16/2015  . Seasonal allergies 03/16/2015  . Low back pain 04/05/2014    Syrena Burges Nilda Simmer PT,  MPH  11/30/2015, [place], [place], [address] Phone: [phone]   Fax:  [phone]  Name: Kirsten Herrera MRN: [id number] Date of Birth: 1976-05-11

## 2015-11-30 NOTE — Patient Instructions (Signed)
Pelvic Press    Place hands under belly between navel and pubic bone, palms up. Feel pressure on hands. Increase pressure on hands by pressing pelvis down. This is NOT a pelvic tilt. Hold _5 sec __ seconds. Relax. Repeat _5-10_ times.

## 2015-12-05 ENCOUNTER — Encounter: Payer: Self-pay | Admitting: Rehabilitative and Restorative Service Providers"

## 2015-12-05 ENCOUNTER — Ambulatory Visit (INDEPENDENT_AMBULATORY_CARE_PROVIDER_SITE_OTHER): Admitting: Rehabilitative and Restorative Service Providers"

## 2015-12-05 DIAGNOSIS — M5416 Radiculopathy, lumbar region: Secondary | ICD-10-CM | POA: Diagnosis not present

## 2015-12-05 DIAGNOSIS — R293 Abnormal posture: Secondary | ICD-10-CM

## 2015-12-05 DIAGNOSIS — R29898 Other symptoms and signs involving the musculoskeletal system: Secondary | ICD-10-CM | POA: Diagnosis not present

## 2015-12-05 NOTE — Patient Instructions (Signed)
Add squats  Shoulder Blade Squeeze    Rotate shoulders back, then squeeze shoulder blades down and back. Hold 10 sec Repeat __10 __ times. Do _several ___ sessions per day.  Resisted External Rotation: in Neutral - Bilateral   PALMS UP Sit or stand, tubing in both hands, elbows at sides, bent to 90, forearms forward. Pinch shoulder blades together and rotate forearms out. Keep elbows at sides. Repeat __10__ times per set. Do _2-3___ sets per session. Do _2-3___ sessions per day.   Low Row: Standing   Face anchor, feet shoulder width apart. Palms up, pull arms back, squeezing shoulder blades together. Repeat 10__ times per set. Do 2-3__ sets per session. Do 2-3__ sessions per week. Anchor Height: Waist     Strengthening: Resisted Extension   Hold tubing in right hand, arm forward. Pull arm back, elbow straight. Repeat _10___ times per set. Do 2-3____ sets per session. Do 2-3____ sessions per day.

## 2015-12-05 NOTE — Therapy (Addendum)
[facility] [place], [place], [address] Phone: [phone]   Fax:  [phone]  Physical Therapy Treatment  Patient Details  Name: Kirsten Herrera MRN: [id number] Date of Birth: Dec 16, 1975 Referring Provider: Dr. Barbaraann Barthel  Encounter Date: 12/05/2015      PT End of Session - 12/05/15 1100    Visit Number 5   Number of Visits 12   Date for PT Re-Evaluation 01/04/16   PT Start Time 1100   PT Stop Time 1150   PT Time Calculation (min) 50 min   Activity Tolerance Patient tolerated treatment well      Past Medical History  Diagnosis Date  . Anxiety   . Asthma   . Seasonal allergies     Past Surgical History  Procedure Laterality Date  . Abdominal surgery      c-section x 2   . Cesarean section    . Tuval ligation    . Tubal ligation      There were no vitals filed for this visit.      Subjective Assessment - 12/05/15 1100    Subjective No pain in the past 3 days - gradually increasing activity level.    Currently in Pain? No/denies                         [facility] Adult PT Treatment/Exercise - 12/05/15 0001    Exercises   Exercises --  scap squeeze w/ noodle 10 sec x 10    Lumbar Exercises: Stretches   Press Ups --  2-3 sec x 5   Quadruped Mid Back Stretch 5 reps   Lumbar Exercises: Aerobic   Stationary Bike Nu step L6 x 5 min arms and legs    Lumbar Exercises: Standing   Scapular Retraction Strengthening;Both;20 reps;Theraband  core engaged    Theraband Level (Scapular Retraction) Level 2 (Red)   Row Strengthening;Both;20 reps;Theraband   Theraband Level (Row) Level 3 (Green)   Shoulder Extension Strengthening;Both;20 reps;Theraband  core engaged   Theraband Level (Shoulder Extension) Level 3 (Green)   Other Standing Lumbar Exercises body blade with core engaged 1 min Rt/Lt/overhead bilat UE's    Other Standing Lumbar Exercises squats x 10 x 2    Lumbar Exercises:  Supine   Bent Knee Raise 10 reps   Other Supine Lumbar Exercises Hooklying position - core engaged with alternating shoulder flexion x 10; dead bug with core engaged x 20    Other Supine Lumbar Exercises 3 part core 10 sec x 10; lg ball pass feet to arms overhead x 10 reps    Lumbar Exercises: Quadruped   Madcat/Old Horse 5 reps   Single Arm Raise 5 reps;Left;Right   Straight Leg Raise 5 reps  Rt/Lt    Opposite Arm/Leg Raise 10 reps;Right arm/Left leg;Left arm/Right leg   Cryotherapy   Number Minutes Cryotherapy 20 Minutes   Cryotherapy Location Lumbar Spine   Type of Cryotherapy Ice pack   Electrical Stimulation   Electrical Stimulation Location bilat L4/5; Rt L3/4; Rt hip    Electrical Stimulation Action IFC   Electrical Stimulation Parameters to tolerance   Electrical Stimulation Goals Pain;Tone                PT Education - 12/05/15 1118    Education provided Yes   Education Details HEP   Person(s) Educated Patient   Methods Explanation;Demonstration;Tactile cues;Verbal cues;Handout   Comprehension Verbalized understanding;Returned demonstration;Verbal cues required;Tactile cues  required          PT Short Term Goals - 11/28/15 1219    PT SHORT TERM GOAL #1   Title Improve 3 part core allowing patient to progress with appropriate core stabilization program 12/06/25   Time 2   Period Weeks   Status On-going   PT SHORT TERM GOAL #2   Title Improve coccyx pain by 50-75% eliminating radiating pain into this area vs referral to pelvic floor specialist if symptoms persist  12/14/15   Time 3   Period Weeks   Status On-going   PT SHORT TERM GOAL #3   Title Begin walking program with pt reporting walking for 20-30 min 3-5 times/week 12/14/15   Time 3   Period Weeks   Status On-going   PT SHORT TERM GOAL #4   Title Improve standing posture with patient to stand without hyperextending knees 12/21/15   Time 4   Period Weeks   Status On-going           PT Long  Term Goals - 12/05/15 1141    PT LONG TERM GOAL #1   Title Improve core stability progressing to appropriate program for home and work  01/04/16   Time 6   Period Weeks   Status On-going   PT LONG TERM GOAL #2   Title Instruct pt in good body mechanics and back care with pt to demo good techniques in clinic and report imporoved mechanics at home 01/04/16   Time 6   Period Weeks   Status Achieved   PT LONG TERM GOAL #3   Title Patient reports ability to sit/stand/walk for functional activities.01/04/16   Time 6   Period Weeks   Status On-going   PT LONG TERM GOAL #4   Title I in HEP 01/04/16   Time 6   Period Weeks   Status On-going               Plan - 12/05/15 1139    Clinical Impression Statement Excellent progress. Patient reports three days pain free with gradual increase in activity level. She is working on her exercises at home and added new exercises in the clinic without difficulty. Progressing well toward stated goals of therapy.    Rehab Potential Good   PT Frequency 2x / week   PT Duration 6 weeks   PT Treatment/Interventions Neuromuscular re-education;Patient/family education;ADLs/Self Care Home Management;Cryotherapy;Electrical Stimulation;Iontophoresis 28m/ml Dexamethasone;Moist Heat;Ultrasound;Traction;Therapeutic activities;Therapeutic exercise;Dry needling;Manual techniques   PT Next Visit Plan Continue core stabilization, engaging core with exercise and activity.  Address coccyx pain with manual work and modalities as indicated. Modalities and TDN as indicated. Add doorway stretch    PT Home Exercise Plan HEP    Consulted and Agree with Plan of Care Patient      Patient will benefit from skilled therapeutic intervention in order to improve the following deficits and impairments:  Postural dysfunction, Improper body mechanics, Increased fascial restricitons, Increased muscle spasms, Pain, Decreased mobility, Decreased endurance, Decreased activity  tolerance  Visit Diagnosis: Abnormal posture  Lumbar radiculopathy  Other symptoms and signs involving the musculoskeletal system     Problem List Patient Active Problem List   Diagnosis Date Noted  . Hypovitaminosis D 04/21/2015  . Chronic fatigue 04/21/2015  . Globus pharyngeus 04/12/2015  . Dysphagia, pharyngoesophageal phase 03/16/2015  . Depression 03/16/2015  . Seasonal allergies 03/16/2015  . Low back pain 04/05/2014    Anaiya Wisinski PNilda SimmerPT, MPH  12/05/2015, 1:12 PM   Outpatient  [facility] [place], [place], [address] Phone: [phone]   Fax:  [phone]  Name: Kirsten Herrera MRN: [id number] Date of Birth: 1975/10/29    PHYSICAL THERAPY DISCHARGE SUMMARY  Visits from Start of Care: 5  Current functional level related to goals / functional outcomes: Independent in exercise with no pain for three days prior to last visit. Good progress with mobility; strength; functional activity level   Remaining deficits: Needs to continue HEP   Education / Equipment: HEP Plan: Patient agrees to discharge.  Patient goals were met. Patient is being discharged due to being pleased with the current functional level.  ?????    Lyda Colcord P. Helene Kelp PT, MPH 01/11/2016 10:04 AM

## 2015-12-08 ENCOUNTER — Encounter: Admitting: Rehabilitative and Restorative Service Providers"

## 2015-12-22 ENCOUNTER — Ambulatory Visit: Admitting: Family Medicine

## 2015-12-27 ENCOUNTER — Ambulatory Visit (INDEPENDENT_AMBULATORY_CARE_PROVIDER_SITE_OTHER): Admitting: Family Medicine

## 2015-12-27 ENCOUNTER — Encounter: Payer: Self-pay | Admitting: Family Medicine

## 2015-12-27 VITALS — BP 135/97 | HR 83 | Ht 67.0 in | Wt 165.0 lb

## 2015-12-27 DIAGNOSIS — M5442 Lumbago with sciatica, left side: Secondary | ICD-10-CM | POA: Diagnosis not present

## 2015-12-27 DIAGNOSIS — M5441 Lumbago with sciatica, right side: Secondary | ICD-10-CM | POA: Diagnosis not present

## 2015-12-28 NOTE — Progress Notes (Signed)
Patient ID: Kirsten Herrera, female   DOB: 1976/02/06, 40 y.o.   MRN: [id number]  PCP: Emeterio Reeve, DO  Subjective:   HPI: Patient is a 40 y.o. female here for severe low back pain.  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 During class she felt a sharp twinge on left side of low back. Some pain but able to continue with class. Over 15 minutes though and on car ride home had severe low back pain with shooting pains into left leg. Given prednisone, metaxolone, norco - only mild benefit with these. No prior issues with her back. Has numbness/tingling into left leg that wraps around to medial side of foot. No bowel/bladder dysfunction. Radiographs 8/21 showed mild OA at L5-S1, slight scoliosis. Pain is currently 10/10 and has worsened over past few days.  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 Felt a sharp pop and pain in middle of low back radiating into both legs. Pain is severe, sharp at 8/10 level still. Feels more into right leg than left leg. No numbness or tingling. Using a cane. Taking prednisone, valium, oxycodone with minimal benefit. No bowel/bladder dysfunction.  4/6: Patient reports she's having pain at 6/10 level, sharp. Pain in low back and tailbone radiating to both hips. Difficulty turning over at night, sitting for a while. Pain also worse with motions though. Feels a click in tailbone area. No new injuries. No skin changes, fever, bowel/bladder dysfunction. 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  5/23: Patient reports she is doing very well. No pain currently. Will get some soreness first thing in the morning. Finished with physical therapy - does home exercises occasionally. No radiation of pain. No bowel/bladder dysfunction.  Past Medical History  Diagnosis Date  . Anxiety   . Asthma   .  Seasonal allergies     Current Outpatient Prescriptions on File Prior to Visit  Medication Sig Dispense Refill  . amitriptyline (ELAVIL) 25 MG tablet     . escitalopram (LEXAPRO) 20 MG tablet Take 20 mg by mouth daily.    Marland Kitchen HYDROcodone-acetaminophen (NORCO) 5-325 MG tablet Take 1 tablet by mouth every 6 (six) hours as needed. (Patient not taking: Reported on 11/23/2015) 40 tablet 0  . meloxicam (MOBIC) 15 MG tablet Take 1 tablet (15 mg total) by mouth daily. 30 tablet 2   No current facility-administered medications on file prior to visit.    Past Surgical History  Procedure Laterality Date  . Abdominal surgery      c-section x 2   . Cesarean section    . Tuval ligation    . Tubal ligation      No Known Allergies  Social History   Social History  . Marital Status: Married    Spouse Name: N/A  . Number of Children: N/A  . Years of Education: N/A   Occupational History  . Not on file.   Social History Main Topics  . Smoking status: Former Smoker    Quit date: 08/17/2005  . Smokeless tobacco: Never Used  . Alcohol Use: 0.0 oz/week    0 Standard drinks or equivalent per week  . Drug Use: No  . Sexual Activity: Yes    Birth Control/ Protection: None   Other Topics Concern  . Not on file   Social History Narrative    Family History  Problem Relation Age  of Onset  . Cancer Other     throat  . Diabetes Other   . Stroke Other   . Parkinson's disease Father     Grandmother said Dad had Wolf Parkinson White Disease     BP 135/97 mmHg  Pulse 83  Ht 5\' 7"  (1.702 m)  Wt 165 lb (74.844 kg)  BMI 25.84 kg/m2  Review of Systems: See HPI above.    Objective:  Physical Exam:  Gen: Uncomfortable in exam room.  Back: No gross deformity, scoliosis. No TTP bilateral SI joints, sacrum, coccyx.  No other tenderness. FROM without pain. Strength LEs 5/5 all muscle groups. 2+ MSRs in patellar and achilles tendons, equal bilaterally. Negative SLR  bilaterally. Sensation intact to light touch currently. Negative logroll bilateral hips Fabers negative bilaterally.    Assessment & Plan:  1. Low back pain - Initially history and exam consistent with radiculopathy - last visit had more SI joint dysfunction.  Has improved with physical therapy, mobic, using norco as needed.  Cleared now for all activities without restrictions.  F/u prn.

## 2015-12-28 NOTE — Assessment & Plan Note (Signed)
Initially history and exam consistent with radiculopathy - last visit had more SI joint dysfunction.  Has improved with physical therapy, mobic, using norco as needed.  Cleared now for all activities without restrictions.  F/u prn.

## 2016-04-07 ENCOUNTER — Emergency Department (INDEPENDENT_AMBULATORY_CARE_PROVIDER_SITE_OTHER)
Admission: EM | Admit: 2016-04-07 | Discharge: 2016-04-07 | Disposition: A | Discharge status: Home / Self Care | Source: Home / Self Care | Attending: Family Medicine | Admitting: Family Medicine

## 2016-04-07 ENCOUNTER — Encounter: Payer: Self-pay | Admitting: Emergency Medicine

## 2016-04-07 DIAGNOSIS — J069 Acute upper respiratory infection, unspecified: Secondary | ICD-10-CM

## 2016-04-07 LAB — POCT RAPID STREP A (OFFICE): RAPID STREP A SCREEN: NEGATIVE

## 2016-04-07 MED ORDER — PREDNISONE 20 MG PO TABS
ORAL_TABLET | ORAL | 0 refills | Status: DC
Start: 2016-04-07 — End: 2016-05-15

## 2016-04-07 MED ORDER — AMOXICILLIN 875 MG PO TABS: 875.0000 mg | ORAL_TABLET | Freq: Two times a day (BID) | ORAL | 0 refills | Status: DC

## 2016-04-07 NOTE — ED Provider Notes (Signed)
[name]    CSN: [id number] Arrival date & time: 04/07/16  [id number]  First Provider Contact:  First MD Initiated Contact with Patient 04/07/16 1035        History   Chief Complaint Chief Complaint  Patient presents with  . Facial Pain  . Nasal Congestion    HPI Kirsten Herrera is a 40 y.o. female.   About 8 days ago patient developed a sore throat and sinus congestion.  She has remained fatigued with fever, myalgias, facial pain, and rare cough.   The history is provided by the patient.    Past Medical History:  Diagnosis Date  . Anxiety   . Asthma   . Seasonal allergies     Patient Active Problem List   Diagnosis Date Noted  . Hypovitaminosis D 04/21/2015  . Chronic fatigue 04/21/2015  . Globus pharyngeus 04/12/2015  . Dysphagia, pharyngoesophageal phase 03/16/2015  . Depression 03/16/2015  . Seasonal allergies 03/16/2015  . Low back pain 04/05/2014    Past Surgical History:  Procedure Laterality Date  . ABDOMINAL SURGERY     c-section x 2   . CESAREAN SECTION    . TUBAL LIGATION    . tuval ligation      OB History    No data available       Home Medications    Prior to Admission medications   Medication Sig Start Date End Date Taking? Authorizing Provider  amitriptyline (ELAVIL) 25 MG tablet  04/07/15   Historical Provider, MD  amoxicillin (AMOXIL) 875 MG tablet Take 1 tablet (875 mg total) by mouth 2 (two) times daily. (Rx void after 04/15/16) 04/07/16   Kandra Nicolas, MD  escitalopram (LEXAPRO) 20 MG tablet Take 20 mg by mouth daily.    Historical Provider, MD  HYDROcodone-acetaminophen (NORCO) 5-325 MG tablet Take 1 tablet by mouth every 6 (six) hours as needed. Patient not taking: Reported on 11/23/2015 11/10/15   Dene Gentry, MD  meloxicam (MOBIC) 15 MG tablet Take 1 tablet (15 mg total) by mouth daily. 11/10/15   Dene Gentry, MD  predniSONE (DELTASONE) 20 MG tablet Take one tab by mouth twice daily for 5 days, then one daily  for 3 days. Take with food. 04/07/16   Kandra Nicolas, MD    Family History Family History  Problem Relation Age of Onset  . Parkinson's disease Father     Grandmother said Dad had [employer] White Disease   . Cancer Other     throat  . Diabetes Other   . Stroke Other     Social History Social History  Substance Use Topics  . Smoking status: Former Smoker    Quit date: 08/17/2005  . Smokeless tobacco: Never Used  . Alcohol use 0.0 oz/week     Allergies   Review of patient's allergies indicates no known allergies.   Review of Systems Review of Systems + sore throat + rare cough No pleuritic pain No wheezing + nasal congestion + post-nasal drainage + sinus pain/pressure No itchy/red eyes No earache No hemoptysis No SOB + fever, + chills No nausea No vomiting No abdominal pain No diarrhea No urinary symptoms No skin rash + fatigue + myalgias + headache Used OTC meds without relief   Physical Exam Triage Vital Signs ED Triage Vitals  Enc Vitals Group     BP 04/07/16 1030 120/75     Pulse Rate 04/07/16 1030 80     Resp 04/07/16 1030 16  Temp 04/07/16 1030 97.7 F (36.5 C)     Temp Source 04/07/16 1030 Oral     SpO2 04/07/16 1030 100 %     Weight 04/07/16 1031 170 lb (77.1 kg)     Height 04/07/16 1031 5\' 7"  (1.702 m)     Head Circumference --      Peak Flow --      Pain Score 04/07/16 1033 0     Pain Loc --      Pain Edu? --      Excl. in [place]? --    No data found.   Updated Vital Signs BP 120/75 (BP Location: Left Arm)   Pulse 80   Temp 97.7 F (36.5 C) (Oral)   Resp 16   Ht 5\' 7"  (1.702 m)   Wt 170 lb (77.1 kg)   LMP 03/10/2016   SpO2 100%   BMI 26.63 kg/m   Visual Acuity Right Eye Distance:   Left Eye Distance:   Bilateral Distance:    Right Eye Near:   Left Eye Near:    Bilateral Near:     Physical Exam Nursing notes and Vital Signs reviewed. Appearance:  Patient appears stated age, and in no acute distress Eyes:   Pupils are equal, round, and reactive to light and accomodation.  Extraocular movement is intact.  Conjunctivae are not inflamed  Ears:  Canals normal.  Tympanic membranes normal.  Nose:  Mildly congested turbinates.  Mild maxillary sinus tenderness is present.  Pharynx:  Normal Neck:  Supple.  Tender enlarged posterior/lateral nodes are palpated bilaterally.  Tonsillar nodes are tender but not enlarged. Lungs:  Clear to auscultation.  Breath sounds are equal.  Moving air well. Heart:  Regular rate and rhythm without murmurs, rubs, or gallops.  Abdomen:  Nontender without masses or hepatosplenomegaly.  Bowel sounds are present.  No CVA or flank tenderness.  Extremities:  No edema.  Skin:  No rash present.    UC Treatments / Results  Labs (all labs ordered are listed, but only abnormal results are displayed) Labs Reviewed  POCT RAPID STREP A (OFFICE) negative    EKG  EKG Interpretation None       Radiology No results found.  Procedures Procedures (including critical care time)  Medications Ordered in UC Medications - No data to display   Initial Impression / Assessment and Plan / UC Course  I have reviewed the triage vital signs and the nursing notes.  Pertinent labs & imaging results that were available during my care of the patient were reviewed by me and considered in my medical decision making (see chart for details).  Clinical Course   There is no evidence of bacterial infection today.  Begin prednisone burst/taper  Continue Mucinex D, or Mucinex and Sudafed, with plenty of water, for cough and congestion.  Get adequate rest.   May use Afrin nasal spray (or generic oxymetazoline) twice daily for about 5 days and then discontinue.  Also recommend using saline nasal spray several times daily and saline nasal irrigation (AYR is a common brand).  Use Flonase nasal spray each morning after using Afrin nasal spray and saline nasal irrigation. Try warm salt water gargles for  sore throat.  Stop all antihistamines for now, and other non-prescription cough/cold preparations. May take Delsym Cough Suppressant at bedtime for nighttime cough.  If wheezing develops, resume Advair inhaler.  Use albuterol inhaler as needed. Begin Amoxicillin if not improving about one week or if persistent fever develops  Follow-up with family doctor if not improving about10 days.       Final Clinical Impressions(s) / UC Diagnoses   Final diagnoses:  Viral URI    New Prescriptions Discharge Medication List as of 04/07/2016 11:08 AM    START taking these medications   Details  amoxicillin (AMOXIL) 875 MG tablet Take 1 tablet (875 mg total) by mouth 2 (two) times daily. (Rx void after 04/15/16), Starting Sat 04/07/2016, Print    predniSONE (DELTASONE) 20 MG tablet Take one tab by mouth twice daily for 5 days, then one daily for 3 days. Take with food., Print         Kandra Nicolas, MD 04/17/16 [phone]

## 2016-04-07 NOTE — Discharge Instructions (Addendum)
Continue Mucinex D, or Mucinex and Sudafed, with plenty of water, for cough and congestion.  Get adequate rest.   May use Afrin nasal spray (or generic oxymetazoline) twice daily for about 5 days and then discontinue.  Also recommend using saline nasal spray several times daily and saline nasal irrigation (AYR is a common brand).  Use Flonase nasal spray each morning after using Afrin nasal spray and saline nasal irrigation. Try warm salt water gargles for sore throat.  Stop all antihistamines for now, and other non-prescription cough/cold preparations. May take Delsym Cough Suppressant at bedtime for nighttime cough.  If wheezing develops, resume Advair inhaler.  Use albuterol inhaler as needed. Begin Amoxicillin if not improving about one week or if persistent fever develops   Follow-up with family doctor if not improving about10 days.

## 2016-05-15 ENCOUNTER — Encounter: Payer: Self-pay | Admitting: Osteopathic Medicine

## 2016-05-15 ENCOUNTER — Ambulatory Visit (INDEPENDENT_AMBULATORY_CARE_PROVIDER_SITE_OTHER): Admitting: Osteopathic Medicine

## 2016-05-15 VITALS — BP 128/79 | HR 66 | Ht 67.0 in | Wt 183.0 lb

## 2016-05-15 DIAGNOSIS — R42 Dizziness and giddiness: Secondary | ICD-10-CM

## 2016-05-15 NOTE — Patient Instructions (Addendum)
   Maneuvers at home - see attached sheet  Meclizine as needed - can take 25 mg if the 12.5 isn't helping  Avoid use of Meclizine and Hydroxyzine together - both have antihistamine properties  Try treating with your usual migraine medications  If no better in 3 days or so, or if worse, let us know and we will get you scheduled for MRI

## 2016-05-15 NOTE — Progress Notes (Signed)
HPI: Kirsten Herrera is a 40 y.o. female  who presents to [place] today, 05/15/16,  for chief complaint of:  Chief Complaint  Patient presents with  . Follow-up    seen for Vertigo    Reports dizziness and nausea. THinks may have initially had something to do with Trintellix started 04/26/16, side effects were severe, this was stopped 05/03/16 and then she started having problems with lightheadedness and dizziness here and there, but now is all the time. This morning threw up, migraine yesterday. Meclizine is helping at first but not any more. Feels like ears are full, having TMJ type pain. Hasn't noticed if worse with head movements But hasn't really been paying attention to timing enough to know for sure     Past medical, surgical, social and family history reviewed: Past Medical History:  Diagnosis Date  . Anxiety   . Asthma   . Seasonal allergies    Past Surgical History:  Procedure Laterality Date  . ABDOMINAL SURGERY     c-section x 2   . CESAREAN SECTION    . TUBAL LIGATION    . tuval ligation     Social History  Substance Use Topics  . Smoking status: Former Smoker    Quit date: 08/17/2005  . Smokeless tobacco: Never Used  . Alcohol use 0.0 oz/week   Family History  Problem Relation Age of Onset  . Parkinson's disease Father     Grandmother said Dad had [employer] White Disease   . Cancer Other     throat  . Diabetes Other   . Stroke Other      Current medication list and allergy/intolerance information reviewed:   Current Outpatient Prescriptions  Medication Sig Dispense Refill  . hydrOXYzine (ATARAX/VISTARIL) 25 MG tablet     . lamoTRIgine (LAMICTAL) 25 MG tablet     . meclizine (ANTIVERT) 12.5 MG tablet      No current facility-administered medications for this visit.    No Known Allergies    Review of Systems:  Constitutional:  No  fever, no chills, +recent illness - viral URI seen in UC 04/07/2016, No  unintentional weight changes. No significant fatigue.   HEENT: +No  headache, no vision change, no hearing change, No sore throat, +sinus pressure  Cardiac: No  chest pain, No  pressure, No palpitations  Respiratory:  No  shortness of breath.  Gastrointestinal: No  abdominal pain, +nausea  Musculoskeletal: No new myalgia/arthralgia  Skin: No  Rash  Neurologic: No  weakness, +dizziness, No  slurred speech/focal weakness/facial droop   Exam:  BP 128/79   Pulse 66   Ht 5\' 7"  (1.702 m)   Wt 183 lb (83 kg)   BMI 28.66 kg/m   Constitutional: VS see above. General Appearance: alert, well-developed, well-nourished, NAD  Eyes: Normal lids and conjunctive, non-icteric sclera, EOMI without nystagmus or reproduction of symptoms, PERRLA  Ears, Nose, Mouth, Throat: MMM, Normal external inspection ears/nares/mouth/lips/gums. TM normal bilaterally. Pharynx/tonsils no erythema, no exudate. Nasal mucosa normal.   Neck: No masses, trachea midline. No thyroid enlargement. Normal ROM  Respiratory: Normal respiratory effort. no wheeze, no rhonchi, no rales  Cardiovascular: S1/S2 normal, no murmur, no rub/gallop auscultated. RRR. No lower extremity edema.  Gastrointestinal: Nontender, no masses.   Musculoskeletal: Gait normal.   Neurological: No cranial nerve deficit on limited exam. Motor and sensation intact and symmetric. Cerebellar reflexes intact. Normal balance/coordination. No tremor. (+) R dix-hallpike/epley elicits dizziness/vertigo  Skin: warm, dry,  intact. No rash/ulcer.   Psychiatric: Normal judgment/insight. Normal mood and affect. Oriented x3.    ASSESSMENT/PLAN:   Vestibular dizziness involving right inner ear - BPPV vs viral/neuritis vs eustachian tube dysfunction vs vestibular migraine vs other. MRI if no improvement with modified Epley or if worse/change   Patient Instructions   Maneuvers at home - see attached sheet  Meclizine as needed - can take 25 mg if the 12.5  isn't helping  Avoid use of Meclizine and Hydroxyzine together - both have antihistamine properties  Try treating with your usual migraine medications  If no better in 3 days or so, or if worse, let us know and we will get you scheduled for MRI      Visit summary with medication list and pertinent instructions was printed for patient to review. All questions at time of visit were answered - patient instructed to contact office with any additional concerns. ER/RTC precautions were reviewed with the patient. Follow-up plan: Return if symptoms worsen or fail to improve.

## 2016-05-16 DIAGNOSIS — R42 Dizziness and giddiness: Secondary | ICD-10-CM | POA: Insufficient documentation

## 2016-06-05 ENCOUNTER — Encounter: Payer: Self-pay | Admitting: *Deleted

## 2016-06-05 ENCOUNTER — Emergency Department (INDEPENDENT_AMBULATORY_CARE_PROVIDER_SITE_OTHER)

## 2016-06-05 ENCOUNTER — Emergency Department (INDEPENDENT_AMBULATORY_CARE_PROVIDER_SITE_OTHER)
Admission: EM | Admit: 2016-06-05 | Discharge: 2016-06-05 | Disposition: A | Discharge status: Home / Self Care | Source: Home / Self Care | Attending: Family Medicine | Admitting: Family Medicine

## 2016-06-05 DIAGNOSIS — M79671 Pain in right foot: Secondary | ICD-10-CM

## 2016-06-05 DIAGNOSIS — S9031XA Contusion of right foot, initial encounter: Secondary | ICD-10-CM | POA: Diagnosis not present

## 2016-06-05 MED ORDER — IBUPROFEN 600 MG PO TABS
600.0000 mg | ORAL_TABLET | Freq: Once | ORAL | Status: AC
  Administered 2016-06-05: 600 mg via ORAL

## 2016-06-05 NOTE — ED Provider Notes (Signed)
CSN: [id number]     Arrival date & time 06/05/16  0907 History   First MD Initiated Contact with Patient 06/05/16 [phone]     Chief Complaint  Patient presents with  . Foot Injury   (Consider location/radiation/quality/duration/timing/severity/associated sxs/prior Treatment) HPI Kirsten Herrera is a 40 y.o. female presenting to UC with c/o Right foot pain and soreness for 2 days after she jammed her foot into a door due to slipping on wet tile.  Pain is worse on 2nd and 3rd toes and ball of her foot.  Pain is 4/10 at this time.  Worse with palpation. She reports mild left shoulder pain from the fall but most concerned about her foot.    Past Medical History:  Diagnosis Date  . Anxiety   . Asthma   . Seasonal allergies    Past Surgical History:  Procedure Laterality Date  . ABDOMINAL SURGERY     c-section x 2   . CESAREAN SECTION    . TUBAL LIGATION    . tuval ligation     Family History  Problem Relation Age of Onset  . Parkinson's disease Father     Grandmother said Dad had [employer] White Disease   . Cancer Other     throat  . Diabetes Other   . Stroke Other    Social History  Substance Use Topics  . Smoking status: Former Smoker    Quit date: 08/17/2005  . Smokeless tobacco: Never Used  . Alcohol use 0.0 oz/week   OB History    No data available     Review of Systems  Musculoskeletal: Positive for arthralgias, gait problem (increased pain walking on Right foot) and myalgias. Negative for joint swelling.  Skin: Negative for color change and wound.  Neurological: Negative for weakness and numbness.    Allergies  Review of patient's allergies indicates no known allergies.  Home Medications   Prior to Admission medications   Medication Sig Start Date End Date Taking? Authorizing Provider  hydrOXYzine (ATARAX/VISTARIL) 25 MG tablet  05/04/16   Historical Provider, MD  lamoTRIgine (LAMICTAL) 25 MG tablet  05/09/16   Historical Provider, MD  meclizine  (ANTIVERT) 12.5 MG tablet  05/09/16   Historical Provider, MD   Meds Ordered and Administered this Visit   Medications  ibuprofen (ADVIL,MOTRIN) tablet 600 mg (600 mg Oral Given 06/05/16 0936)    BP 137/89 (BP Location: Left Arm)   Pulse 68   LMP 05/20/2016   SpO2 100%  No data found.   Physical Exam  Constitutional: She is oriented to person, place, and time. She appears well-developed and well-nourished.  HENT:  Head: Normocephalic and atraumatic.  Eyes: EOM are normal.  Neck: Normal range of motion.  Cardiovascular: Normal rate.   Pulses:      Dorsalis pedis pulses are 2+ on the right side.       Posterior tibial pulses are 2+ on the right side.  Pulmonary/Chest: Effort normal.  Musculoskeletal: Normal range of motion. She exhibits tenderness. She exhibits no edema.  Right foot: no obvious deformity.  No edema. Tenderness to 2nd and 3rd toes and ball of foot. Full ROM. No tenderness to ankle.   Neurological: She is alert and oriented to person, place, and time.  Skin: Skin is warm and dry. Capillary refill takes less than 2 seconds.  Right foot: skin in tact, no ecchymosis or erythema.   Psychiatric: She has a normal mood and affect. Her behavior is normal.  Nursing  note and vitals reviewed.   Urgent Care Course   Clinical Course    Procedures (including critical care time)  Labs Review Labs Reviewed - No data to display  Imaging Review Dg Foot Complete Right  Result Date: 06/05/2016 CLINICAL DATA:  Right foot pain.  Fall. EXAM: RIGHT FOOT COMPLETE - 3+ VIEW COMPARISON:  No recent prior. FINDINGS: No acute bony or joint abnormality identified. No evidence of fracture or dislocation. IMPRESSION: No acute abnormality identified. No evidence of fracture or dislocation. Electronically Signed   By: Marcello Moores  Register   On: 06/05/2016 10:01      MDM   1. Contusion of right foot, initial encounter   2. Right foot pain    Pt c/o Right foot pain secondary to hitting  on a door 2 days ago.  PMS in tact.   Plain films: Negative for fracture or dislocation.    Ace wrap and crutches provided for comfort. May have acetaminophen and ibuprofen as needed for pain. Home care instructions provided. F/u with PCP in 1-2 weeks if not improving, sooner if worsening.   Noland Fordyce, PA-C 06/05/16 1013

## 2016-06-05 NOTE — ED Triage Notes (Signed)
Pt reports slipping and falling, causing her right foot to slam into a door. C/o right 2nd and 3rd toe pain, pain @ the ball of her foot and her heel.

## 2016-06-12 ENCOUNTER — Ambulatory Visit (INDEPENDENT_AMBULATORY_CARE_PROVIDER_SITE_OTHER)

## 2016-06-12 ENCOUNTER — Encounter: Payer: Self-pay | Admitting: Family Medicine

## 2016-06-12 ENCOUNTER — Ambulatory Visit (INDEPENDENT_AMBULATORY_CARE_PROVIDER_SITE_OTHER): Admitting: Family Medicine

## 2016-06-12 DIAGNOSIS — M79671 Pain in right foot: Secondary | ICD-10-CM | POA: Diagnosis not present

## 2016-06-12 DIAGNOSIS — M5416 Radiculopathy, lumbar region: Secondary | ICD-10-CM | POA: Insufficient documentation

## 2016-06-12 MED ORDER — NAPROXEN 500 MG PO TABS: 500.0000 mg | ORAL_TABLET | Freq: Two times a day (BID) | ORAL | 0 refills | Status: DC

## 2016-06-12 MED ORDER — OMEPRAZOLE 40 MG PO CPDR: 40.0000 mg | DELAYED_RELEASE_CAPSULE | Freq: Every day | ORAL | 3 refills | Status: DC

## 2016-06-12 NOTE — Patient Instructions (Addendum)
Thank you for coming in today. Use the post op shoe and crutches as needed.  Get an xray today.  Use naproxen twice daily for pain.  Use tylenol if pain is not controlled.  Take omeprazole daily for stomach protection.  Recheck in 2-3 weeks.    Foot Contusion A foot contusion is a deep bruise to the foot. Contusions are the result of an injury that caused bleeding under the skin. The contusion may turn blue, purple, or yellow. Minor injuries will give you a painless contusion, but more severe contusions may stay painful and swollen for a few weeks. CAUSES  A foot contusion comes from a direct blow to that area, such as a heavy object falling on the foot. SYMPTOMS   Swelling of the foot.  Discoloration of the foot.  Tenderness or soreness of the foot. DIAGNOSIS  You will have a physical exam and will be asked about your history. You may need an X-ray of your foot to look for a broken bone (fracture).  TREATMENT  An elastic wrap may be recommended to support your foot. Resting, elevating, and applying cold compresses to your foot are often the best treatments for a foot contusion. Over-the-counter medicines may also be recommended for pain control. HOME CARE INSTRUCTIONS   Put ice on the injured area.  Put ice in a plastic bag.  Place a towel between your skin and the bag.  Leave the ice on for 15-20 minutes, 03-04 times a day.  Only take over-the-counter or prescription medicines for pain, discomfort, or fever as directed by your caregiver.  If told, use an elastic wrap as directed. This can help reduce swelling. You may remove the wrap for sleeping, showering, and bathing. If your toes become numb, cold, or blue, take the wrap off and reapply it more loosely.  Elevate your foot with pillows to reduce swelling.  Try to avoid standing or walking while the foot is painful. Do not resume use until instructed by your caregiver. Then, begin use gradually. If pain develops, decrease  use. Gradually increase activities that do not cause discomfort until you have normal use of your foot.  See your caregiver as directed. It is very important to keep all follow-up appointments in order to avoid any lasting problems with your foot, including long-term (chronic) pain. SEEK IMMEDIATE MEDICAL CARE IF:   You have increased redness, swelling, or pain in your foot.  Your swelling or pain is not relieved with medicines.  You have loss of feeling in your foot or are unable to move your toes.  Your foot turns cold or blue.  You have pain when you move your toes.  Your foot becomes warm to the touch.  Your contusion does not improve in 2 days. MAKE SURE YOU:   Understand these instructions.  Will watch your condition.  Will get help right away if you are not doing well or get worse.   This information is not intended to replace advice given to you by your health care provider. Make sure you discuss any questions you have with your health care provider.   Document Released: 05/14/2006 Document Revised: 01/22/2012 Document Reviewed: 03/29/2015 Elsevier Interactive Patient Education [employer].

## 2016-06-12 NOTE — Progress Notes (Signed)
   Subjective:    I'm seeing this patient as a consultation for:  Emeterio Reeve, DO and Noland Fordyce, PA-C  CC: right foot injury  HPI: Patient slipped and fell on October 28 sliding foot first into a door. The plantar aspect of her metatarsal heads contacted a door forcefully. She developed significant pain contusion and ecchymosis. She was seen in the emergency department on October 31 where x-rays were negative. She was given Ace wrap and crutches. Over a week later she has continued severe pain and is unable to bear weight. She's tried over-the-counter medications which have not helped well to control her pain.  Past medical history, Surgical history, Family history not pertinant except as noted below, Social history, Allergies, and medications have been entered into the medical record, reviewed, and no changes needed.   Review of Systems: No headache, visual changes, nausea, vomiting, diarrhea, constipation, dizziness, abdominal pain, skin rash, fevers, chills, night sweats, weight loss, swollen lymph nodes, body aches, joint swelling, muscle aches, chest pain, shortness of breath, mood changes, visual or auditory hallucinations.   Objective:    Vitals:   06/12/16 1045  BP: 128/84  Pulse: 70   General: Well Developed, well nourished, and in no acute distress.  Neuro/Psych: Alert and oriented x3, extra-ocular muscles intact, able to move all 4 extremities, sensation grossly intact. Skin: Warm and dry, no rashes noted.  Respiratory: Not using accessory muscles, speaking in full sentences, trachea midline.  Cardiovascular: Pulses palpable, no extremity edema. Abdomen: Does not appear distended. MSK: Right foot: Ecchymosis present. Very sensitive to palpation plantar second and third distal aspect metatarsal heads.  No results found for this or any previous visit (from the past 24 hour(s)). Dg Foot Complete Right  Result Date: 06/12/2016 CLINICAL DATA:  40 year old female with a  history of right foot pain EXAM: RIGHT FOOT COMPLETE - 3+ VIEW COMPARISON:  06/05/2016 FINDINGS: No acute displaced fracture. No significant soft tissue swelling. No radiopaque foreign body. No significant degenerative changes. IMPRESSION: Negative for acute bony abnormality. Signed, Dulcy Fanny. Earleen Newport, DO [facility] Electronically Signed   By: Corrie Mckusick D.O.   On: 06/12/2016 14:54    Impression and Recommendations:    Assessment and Plan: 40 y.o. female with foot contusion. This seems to be a severe contusion. Plan for postoperative shoe and weightbearing as tolerated. Plan for watchful waiting and treatment with naproxen ice and rest.    Discussed warning signs or symptoms. Please see discharge instructions. Patient expresses understanding.

## 2016-06-14 ENCOUNTER — Telehealth: Payer: Self-pay

## 2016-06-14 ENCOUNTER — Ambulatory Visit (INDEPENDENT_AMBULATORY_CARE_PROVIDER_SITE_OTHER): Admitting: Family Medicine

## 2016-06-14 DIAGNOSIS — M79671 Pain in right foot: Secondary | ICD-10-CM

## 2016-06-14 NOTE — Telephone Encounter (Signed)
Pt is requesting for you to order the" fancy boot"!

## 2016-06-14 NOTE — Telephone Encounter (Signed)
Pt was fitted for boot today.

## 2016-06-14 NOTE — Progress Notes (Signed)
Patient was given a metatarsal of fluid or shoe and feels much better. Recheck in the near future. No charge for today's visit.

## 2016-06-21 ENCOUNTER — Ambulatory Visit (HOSPITAL_BASED_OUTPATIENT_CLINIC_OR_DEPARTMENT_OTHER)
Admission: RE | Admit: 2016-06-21 | Discharge: 2016-06-21 | Disposition: A | Discharge status: Home / Self Care | Source: Ambulatory Visit | Attending: Family Medicine | Admitting: Family Medicine

## 2016-06-21 ENCOUNTER — Telehealth: Payer: Self-pay | Admitting: Family Medicine

## 2016-06-21 DIAGNOSIS — M79671 Pain in right foot: Secondary | ICD-10-CM | POA: Diagnosis present

## 2016-06-21 NOTE — Telephone Encounter (Signed)
Dr. [name] pt states she has great insurance and would like to go tomorrow to [place] and have it done please. Thanks

## 2016-06-21 NOTE — Telephone Encounter (Signed)
No auth required. Pt to be scanned at [facility] today.

## 2016-06-21 NOTE — Telephone Encounter (Signed)
Patient called at 3:13 pm and advised she has an appointment Tuesday 11/21 but she is in so much pain (crying over the phone) adv that the med you gave her isnt helping and she would like to go head and get MRI as prevs discussed as an option tomorrow if possible and she doesn't mind if it is outside of [facility] her husband is off tomorrow morning and would be able to take her. Thanks

## 2016-06-21 NOTE — Telephone Encounter (Signed)
MRI foot ordered. We will try to prior auth for tomorrow or today at [facility].  If done at [facility] get a CD made after the MRI.

## 2016-06-22 ENCOUNTER — Ambulatory Visit (INDEPENDENT_AMBULATORY_CARE_PROVIDER_SITE_OTHER): Admitting: Sports Medicine

## 2016-06-22 DIAGNOSIS — M79671 Pain in right foot: Secondary | ICD-10-CM

## 2016-06-22 MED ORDER — AMITRIPTYLINE HCL 50 MG PO TABS: ORAL_TABLET | ORAL | 3 refills | Status: DC

## 2016-06-22 NOTE — Telephone Encounter (Signed)
Have he double book with T at 115pm

## 2016-06-22 NOTE — Patient Instructions (Signed)
Complex Regional Pain Syndrome Introduction Complex regional pain syndrome (CRPS) is a nerve disorder that causes long-lasting (chronic) pain, usually in a hand, arm, leg, or foot. CRPS usually follows an injury or trauma, such as a fracture or sprain. There are two types of CRPS:  Type 1. This type occurs after an injury or trauma with no known damage to a nerve.  Type 2. This type occurs after injury or trauma damages a nerve. There are three stages of the condition:  Stage 1. This stage, called the acute stage, may last for three months.  Stage 2. This stage, called the dystrophic stage, may last for three to 12 months.  Stage 3. This stage, called the atrophic stage, may start after one year. CRPS ranges from mild to severe. For most people CRPS is mild and recovery happens over time. For others, CRPS lasts a very long time and is debilitating. What are the causes? The exact cause of CRPS is not known. What increases the risk? You may be at increased risk if:  You are a woman.  You are approximately 40 years of age.  You have any of the following:  A family history of CRPS.  An injury or surgery.  An infection.  Cancer.  Neck problems.  A stroke.  A heart attack.  Asthma. What are the signs or symptoms? Signs and symptoms in the affected limb are different for each stage. Signs and symptoms of stage 1 include:  Burning pain.  A pins and needles sensation.  Extremely sensitive skin.  Swelling.  Joint stiffness.  Warmth and redness.  Excessive sweating.  Hair and nail growth that is faster than normal. Signs and symptoms of stage 2 include:  Spreading of pain to the whole limb.  Increased skin sensitivity.  Increased swelling and stiffness.  Coolness of the skin.  Blue discoloration of skin.  Loss of skin wrinkles.  Brittle fingernails. Signs and symptoms of stage 3 include:  Pain that spreads to other areas of the body but becomes less  severe.  More stiffness, leading to loss of motion.  Skin that is pale, dry, shiny, and tightly stretched. How is this diagnosed? There is no test to diagnose CRPS. Your health care provider will make a diagnosis based on your signs and symptoms and a physical exam. The exam may include tests to rule out other possible causes of your symptoms. Sometimes imaging tests are done, such as an MRI or bone scan. These tests check for bone changes that might indicate CRPS. How is this treated? Early treatment may prevent CRPS from advancing past stage 1. There is no one treatment that works for everyone. Treatment options may include:  Medicines, such as:  Nonsteroidal-anti-inflammatory drugs (NSAIDS).  Steroids.  Blood pressure drugs.  Antidepressants.  Anti-seizure drugs.  Pain relievers.  Exercise.  Occupational and physical therapy.  Biofeedback.  Mental health counseling.  Numbing injections.  Spinal surgery to implant a spinal cord stimulator or a pain pump. Follow these instructions at home:  Take medicines only as directed by your health care provider.  Follow an exercise program as directed by your health care provider.  Maintain a healthy weight.  Keep all follow-up visits as directed by your health care provider. This is important. Contact a health care provider if:  Your symptoms change.  Your symptoms get worse.  You develop anxiety or depression. This information is not intended to replace advice given to you by your health care provider. Make sure you   discuss any questions you have with your health care provider. Document Released: 07/13/2002 Document Revised: 12/29/2015 Document Reviewed: 04/19/2014  2017 Elsevier  

## 2016-06-22 NOTE — Assessment & Plan Note (Signed)
This occurred after a recent fall. X-rays and MRI were negative. Exam is negative, pain is certainly out of proportion with palpation. When she is distracted I am able to examine her foot and take it with range of motion appropriately passively. I explained to her that pain can either be generated distally from an injury extremity or centrally from the brain, and because her foot is structurally normal with good pulses, normal appearance, and normal advanced imaging it is likely that the pain is generated from a more central location, for this reason treatment would not consist of immobilization and measures directed at the foot itself but more centrally, I'm going to add amitriptyline 25 mg for a week and then 50 mg at bedtime. There is certainly a degree of anxiety and myofascial pain syndrome associated with this patient, I would recommend avoiding narcotic controlled substances, she was tearful in the exam room also suggesting inadequately controlled depression. I did not see any color changes consistent with, which regional pain syndrome however this is something we need to keep in the back of our heads.

## 2016-06-22 NOTE — Progress Notes (Signed)
Subjective:    I'm seeing this patient as a consultation for:  Dr. Emeterio Reeve  CC: Right foot pain  HPI: This is a 40 year old female with a history of depression and anxiety. A week to a week and a half ago she slipped and fell hitting the plantar aspect of the ball of her right foot, she was seen by Dr. Georgina Snell and appropriately had an x-ray that was negative. She had severe and continuous pain, and so he ordered an MRI, which was also completely negative. She recently called in complaining of severe pain, as well as a yellowish appearance as well as coldness of the extremity and is brought in for more of an emergency visit. She is nearly screaming in pain and unable to bear weight.  Past medical history:  Negative.  See flowsheet/record as well for more information.  Surgical history: Negative.  See flowsheet/record as well for more information.  Family history: Negative.  See flowsheet/record as well for more information.  Social history: Negative.  See flowsheet/record as well for more information.  Allergies, and medications have been entered into the medical record, reviewed, and no changes needed.   Review of Systems: No headache, visual changes, nausea, vomiting, diarrhea, constipation, dizziness, abdominal pain, skin rash, fevers, chills, night sweats, weight loss, swollen lymph nodes, body aches, joint swelling, muscle aches, chest pain, shortness of breath, mood changes, visual or auditory hallucinations.   Objective:   General: Well Developed, well nourished, and in no acute distress.  Neuro/Psych: Alert and oriented x3, extra-ocular muscles intact, able to move all 4 extremities, sensation grossly intact. Skin: Warm and dry, no rashes noted.  Respiratory: Not using accessory muscles, speaking in full sentences, trachea midline.  Cardiovascular: Pulses palpable, no extremity edema. Abdomen: Does not appear distended. Right Foot: No visible erythema or swelling. Range  of motion is full in all directions. Strength is 5/5 in all directions. No hallux valgus. No pes cavus or pes planus. No abnormal callus noted. No pain over the navicular prominence, or base of fifth metatarsal. No tenderness to palpation of the calcaneal insertion of plantar fascia. No pain at the Achilles insertion. No pain over the calcaneal bursa. No pain of the retrocalcaneal bursa. No tenderness to palpation over the tarsals, metatarsals, or phalanges. No hallux rigidus or limitus. No tenderness palpation over interphalangeal joints. No pain with compression of the metatarsal heads. Neurovascularly intact distally. The patient has pain out of proportion with degree palpation with only gentle palpation of the foot is completely normal in appearance, she has good dorsalis pedis and posterior tibial pulses, and good sensation. When she is distracted I am able to take her foot and her toes to the range of motion appropriately.  Impression and Recommendations:   This case required medical decision making of moderate complexity.  Right foot pain This occurred after a recent fall. X-rays and MRI were negative. Exam is negative, pain is certainly out of proportion with palpation. When she is distracted I am able to examine her foot and take it with range of motion appropriately passively. I explained to her that pain can either be generated distally from an injury extremity or centrally from the brain, and because her foot is structurally normal with good pulses, normal appearance, and normal advanced imaging it is likely that the pain is generated from a more central location, for this reason treatment would not consist of immobilization and measures directed at the foot itself but more centrally, I'm going to  add amitriptyline 25 mg for a week and then 50 mg at bedtime. There is certainly a degree of anxiety and myofascial pain syndrome associated with this patient, I would recommend  avoiding narcotic controlled substances, she was tearful in the exam room also suggesting inadequately controlled depression. I did not see any color changes consistent with, which regional pain syndrome however this is something we need to keep in the back of our heads.

## 2016-06-22 NOTE — Telephone Encounter (Signed)
DONE

## 2016-06-22 NOTE — Telephone Encounter (Signed)
Pt is wanting to know her MRI results. Also she states that her foot is cold, numb, and yellow. Please advise.

## 2016-06-26 ENCOUNTER — Ambulatory Visit: Admitting: Family Medicine

## 2016-08-13 ENCOUNTER — Emergency Department (INDEPENDENT_AMBULATORY_CARE_PROVIDER_SITE_OTHER)
Admission: EM | Admit: 2016-08-13 | Discharge: 2016-08-13 | Disposition: A | Discharge status: Home / Self Care | Source: Home / Self Care | Attending: Family Medicine | Admitting: Family Medicine

## 2016-08-13 DIAGNOSIS — J029 Acute pharyngitis, unspecified: Secondary | ICD-10-CM | POA: Diagnosis not present

## 2016-08-13 DIAGNOSIS — J0100 Acute maxillary sinusitis, unspecified: Secondary | ICD-10-CM | POA: Diagnosis not present

## 2016-08-13 LAB — POCT RAPID STREP A (OFFICE): Rapid Strep A Screen: NEGATIVE

## 2016-08-13 MED ORDER — IPRATROPIUM BROMIDE 0.06 % NA SOLN: 2.0000 | Freq: Four times a day (QID) | NASAL | 1 refills | Status: DC

## 2016-08-13 MED ORDER — AMOXICILLIN-POT CLAVULANATE 875-125 MG PO TABS: 1.0000 | ORAL_TABLET | Freq: Two times a day (BID) | ORAL | 0 refills | Status: DC

## 2016-08-13 NOTE — ED Provider Notes (Signed)
CSN: [id number]     Arrival date & time 08/13/16  [id number] History   First MD Initiated Contact with Patient 08/13/16 [phone]     Chief Complaint  Patient presents with  . Sore Throat  . Cough   (Consider location/radiation/quality/duration/timing/severity/associated sxs/prior Treatment) HPI  Kirsten Herrera is a 41 y.o. female presenting to UC with c/o 8 days of gradually worsening sore throat, sinus congestion and pressure, mild to moderately productive cough.  She has tried Dayquil w/o relief.  She reports subjective fever at initial start of symptoms. Denies n/v/d.  Her son was sick last week with similar symptoms, tested negative for strep.    Past Medical History:  Diagnosis Date  . Anxiety   . Asthma   . Seasonal allergies    Past Surgical History:  Procedure Laterality Date  . ABDOMINAL SURGERY     c-section x 2   . CESAREAN SECTION    . TUBAL LIGATION    . tuval ligation     Family History  Problem Relation Age of Onset  . Parkinson's disease Father     Grandmother said Dad had [employer] White Disease   . Cancer Other     throat  . Diabetes Other   . Stroke Other    Social History  Substance Use Topics  . Smoking status: Former Smoker    Quit date: 08/17/2005  . Smokeless tobacco: Never Used  . Alcohol use 0.0 oz/week   OB History    No data available     Review of Systems  Constitutional: Positive for chills, fatigue and fever.  HENT: Positive for congestion, ear pain (bilateral pressure), postnasal drip, rhinorrhea, sinus pain, sinus pressure and sore throat. Negative for trouble swallowing and voice change.   Respiratory: Positive for cough. Negative for shortness of breath.   Cardiovascular: Negative for chest pain and palpitations.  Gastrointestinal: Negative for abdominal pain, diarrhea, nausea and vomiting.  Musculoskeletal: Negative for arthralgias, back pain and myalgias.  Skin: Negative for rash.  Neurological: Positive for headaches. Negative  for dizziness and light-headedness.    Allergies  Patient has no known allergies.  Home Medications   Prior to Admission medications   Medication Sig Start Date End Date Taking? Authorizing Provider  amitriptyline (ELAVIL) 50 MG tablet One half tab PO qHS for a week, then one tab PO qHS. 06/22/16   Silverio Decamp, MD  amoxicillin-clavulanate (AUGMENTIN) 875-125 MG tablet Take 1 tablet by mouth 2 (two) times daily. One po bid x 7 days 08/13/16   Noland Fordyce, PA-C  hydrOXYzine (ATARAX/VISTARIL) 25 MG tablet  05/04/16   Historical Provider, MD  ipratropium (ATROVENT) 0.06 % nasal spray Place 2 sprays into both nostrils 4 (four) times daily. For 1 week 08/13/16   Noland Fordyce, PA-C  lamoTRIgine (LAMICTAL) 25 MG tablet  05/09/16   Historical Provider, MD   Meds Ordered and Administered this Visit  Medications - No data to display  BP 138/90 (BP Location: Left Arm)   Pulse 89   Temp 97.7 F (36.5 C) (Oral)   Ht 5\' 7"  (1.702 m)   Wt 184 lb 6.4 oz (83.6 kg)   LMP 07/25/2016   SpO2 100%   BMI 28.88 kg/m  No data found.   Physical Exam  Constitutional: She is oriented to person, place, and time. She appears well-developed and well-nourished. No distress.  HENT:  Head: Normocephalic and atraumatic.  Right Ear: Tympanic membrane normal.  Left Ear: Tympanic membrane normal.  Nose: Mucosal edema present. Right sinus exhibits maxillary sinus tenderness. Right sinus exhibits no frontal sinus tenderness. Left sinus exhibits maxillary sinus tenderness. Left sinus exhibits no frontal sinus tenderness.  Mouth/Throat: Uvula is midline and mucous membranes are normal. Posterior oropharyngeal erythema present. No oropharyngeal exudate, posterior oropharyngeal edema or tonsillar abscesses.  Eyes: EOM are normal.  Neck: Normal range of motion. Neck supple.  Cardiovascular: Normal rate and regular rhythm.   Pulmonary/Chest: Effort normal and breath sounds normal. No stridor. No respiratory  distress. She has no wheezes. She has no rales.  Musculoskeletal: Normal range of motion.  Lymphadenopathy:    She has no cervical adenopathy.  Neurological: She is alert and oriented to person, place, and time.  Skin: Skin is warm and dry. She is not diaphoretic.  Psychiatric: She has a normal mood and affect. Her behavior is normal.  Nursing note and vitals reviewed.   Urgent Care Course   Clinical Course     Procedures (including critical care time)  Labs Review Labs Reviewed  POCT RAPID STREP A (OFFICE)    Imaging Review No results found.   MDM   1. Acute non-recurrent maxillary sinusitis   2. Pharyngitis, unspecified etiology    Pt c/o 8 days of worsening sinus congestion and pain, sore throat and cough.  Rapid strep: Negative  On exam, severe maxillary sinus tenderness noted. Will cover for bacterial sinusitis. Rx: Augmentin and ipratropium nasal spray F/u with PCP in 1 week if not improving. Patient verbalized understanding and agreement with treatment plan.     Noland Fordyce, PA-C 08/13/16 [phone]

## 2016-08-13 NOTE — ED Triage Notes (Signed)
Started 8 days ago with sore throat, coughing.  Still having the same symptoms, with redness noted in throat.  Stated ears were popping and hurting also.

## 2016-12-21 ENCOUNTER — Encounter: Payer: Self-pay | Admitting: Osteopathic Medicine

## 2016-12-21 ENCOUNTER — Ambulatory Visit (INDEPENDENT_AMBULATORY_CARE_PROVIDER_SITE_OTHER): Admitting: Osteopathic Medicine

## 2016-12-21 VITALS — BP 140/97 | HR 86 | Temp 97.9°F | Wt 176.0 lb

## 2016-12-21 DIAGNOSIS — J329 Chronic sinusitis, unspecified: Secondary | ICD-10-CM

## 2016-12-21 DIAGNOSIS — H6983 Other specified disorders of Eustachian tube, bilateral: Secondary | ICD-10-CM

## 2016-12-21 DIAGNOSIS — J302 Other seasonal allergic rhinitis: Secondary | ICD-10-CM

## 2016-12-21 DIAGNOSIS — J029 Acute pharyngitis, unspecified: Secondary | ICD-10-CM

## 2016-12-21 DIAGNOSIS — Z8709 Personal history of other diseases of the respiratory system: Secondary | ICD-10-CM

## 2016-12-21 DIAGNOSIS — G933 Postviral fatigue syndrome: Secondary | ICD-10-CM | POA: Diagnosis not present

## 2016-12-21 DIAGNOSIS — G9331 Postviral fatigue syndrome: Secondary | ICD-10-CM

## 2016-12-21 DIAGNOSIS — B9689 Other specified bacterial agents as the cause of diseases classified elsewhere: Secondary | ICD-10-CM

## 2016-12-21 LAB — POCT RAPID STREP A (OFFICE): Rapid Strep A Screen: NEGATIVE

## 2016-12-21 MED ORDER — AMOXICILLIN-POT CLAVULANATE 875-125 MG PO TABS: 1.0000 | ORAL_TABLET | Freq: Two times a day (BID) | ORAL | 0 refills | Status: DC

## 2016-12-21 MED ORDER — METHYLPREDNISOLONE 4 MG PO TBPK: ORAL_TABLET | ORAL | 0 refills | Status: DC

## 2016-12-21 NOTE — [facility] Notes (Signed)
HPI: Kirsten Herrera is a 41 y.o. female who presents to [facility] 12/21/16 for chief complaint of:  Chief Complaint  Patient presents with  . Sore Throat    Acute Illness:  . Patient is distraught over frequency with which she seems to be getting viral type upper respiratory tract infections. Not always come into the office for evaluation states she is getting several sinus infections/colds per year, more than she thinks is normal and she is worried about possible immunocompromise or other serious illness . States she is having significant sinus pressure at this point, last week all started with sore throat which has since resolved, she is still experiencing some significant fatigue issues. Over-the-counter medications do not seem to help.  . No known respiratory irritants, no known history of nasal/sinus anatomic abnormality. Hx globus sensation which has resolved at this point. Hx seasonal allergies.     Past medical, social and family history reviewed.  Immune compromising conditions or other risk factors: none Patient Active Problem List   Diagnosis Date Noted  . Right foot pain 06/12/2016  . Vestibular dizziness involving right inner ear 05/16/2016  . Hypovitaminosis D 04/21/2015  . Chronic fatigue 04/21/2015  . Globus pharyngeus 04/12/2015  . Dysphagia, pharyngoesophageal phase 03/16/2015  . Depression 03/16/2015  . Seasonal allergies 03/16/2015  . Low back pain 04/05/2014    Current medications and allergies reviewed.    Review of Systems:  Constitutional: No  fever/chills  HEENT: sinus headache, No  sore throat, No  swollen glands  Cardiovascular: No chest pain  Respiratory:dry occasional cough, No  shortness of breath  Gastrointestinal: Yes  nausea, No  vomiting,  No  diarrhea  Musculoskeletal:   generalized myalgia/arthralgia  Skin/Integument:  No  rash   Detailed Exam:  BP (!) 140/97   Pulse 86   Temp 97.9 F (36.6  C) (Oral)   Wt 176 lb (79.8 kg)   BMI 27.57 kg/m   Constitutional:   VSS, see above.   General Appearance: alert, well-developed, well-nourished, NAD  Eyes:   Normal lids and conjunctive, non-icteric sclera  Ears, Nose, Mouth, Throat:   Normal external inspection ears/nares  Normal mouth/lips/gums, MMM  normal TM with scant clear effusion behind membranes  posterior pharynx without erythema, without exudate  nasal mucosa normal to pale  Skin:  Normal inspection, no rash or concerning lesions noted on limited exam  Neck:   No masses, trachea midline. normal lymph nodes  Respiratory:   Normal respiratory effort.   No  wheeze/rhonchi/rales  Cardiovascular:   S1/S2 normal, no murmur/rub/gallop auscultated. RRR.   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 Diagnoses of Sore throat, Bacterial sinusitis, Seasonal allergic rhinitis, unspecified trigger, History of frequent upper respiratory infection, and Dysfunction of both eustachian tubes were also pertinent to this visit.    Patient Instructions  Plan:  Antibiotics for possible bacterial sinus infection  Steroids for postviral fatigue syndrome  Flonase or generic equivalent for nasal passage inflammation  Referral to ENT for frequent URI problems  Annual physical and labs w/ me next couple months - call ahead to get lab orders  Call if worse/change, or if any questions!  Visit summary was printed for the patient with medications and pertinent instructions for patient to review. ER/RTC precautions reviewed. All questions answered. Return if symptoms worsen or fail to improve, and as directed for annual physical .

## 2016-12-21 NOTE — Patient Instructions (Signed)
Plan:  Antibiotics for possible bacterial sinus infection  Steroids for postviral fatigue syndrome  Flonase or generic equivalent for nasal passage inflammation  Referral to ENT for frequent URI problems  Annual physical and labs w/ me next couple months - call ahead to get lab orders  Call if worse/change, or if any questions!

## 2017-01-02 ENCOUNTER — Encounter: Payer: Self-pay | Admitting: Osteopathic Medicine

## 2017-01-02 DIAGNOSIS — J0141 Acute recurrent pansinusitis: Secondary | ICD-10-CM | POA: Insufficient documentation

## 2017-04-15 ENCOUNTER — Ambulatory Visit (INDEPENDENT_AMBULATORY_CARE_PROVIDER_SITE_OTHER): Admitting: Osteopathic Medicine

## 2017-04-15 ENCOUNTER — Encounter: Payer: Self-pay | Admitting: Osteopathic Medicine

## 2017-04-15 VITALS — BP 119/87 | HR 92 | Wt 176.0 lb

## 2017-04-15 DIAGNOSIS — R1319 Other dysphagia: Secondary | ICD-10-CM | POA: Diagnosis not present

## 2017-04-15 DIAGNOSIS — Z23 Encounter for immunization: Secondary | ICD-10-CM | POA: Diagnosis not present

## 2017-04-15 DIAGNOSIS — Z0000 Encounter for general adult medical examination without abnormal findings: Secondary | ICD-10-CM

## 2017-04-15 NOTE — [facility] Notes (Signed)
HPI: Kirsten Herrera is a 41 y.o. female  who presents to [facility] today, 04/15/17,  for chief complaint of:  Chief Complaint  Patient presents with  . Mass    sensationin throat   Needs referral fr GI. Previous dilation few years ago. Now feeling similar symptoms. Needs referral. Has been taking allergy medications but symptoms are persistent. Constant lump feeling with difficulty swallowing - choking feeling though she can keep food and liquids down    Past medical history, surgical history, social history and family history reviewed.  Patient Active Problem List   Diagnosis Date Noted  . Recurrent pansinusitis 01/02/2017  . Right foot pain 06/12/2016  . Vestibular dizziness involving right inner ear 05/16/2016  . Hypovitaminosis D 04/21/2015  . Chronic fatigue 04/21/2015  . Globus pharyngeus 04/12/2015  . Dysphagia, pharyngoesophageal phase 03/16/2015  . Depression 03/16/2015  . Seasonal allergies 03/16/2015  . Low back pain 04/05/2014    Current medication list and allergy/intolerance information reviewed.   Current Outpatient Prescriptions on File Prior to Visit  Medication Sig Dispense Refill  . amitriptyline (ELAVIL) 50 MG tablet One half tab PO qHS for a week, then one tab PO qHS. 30 tablet 3  . amoxicillin-clavulanate (AUGMENTIN) 875-125 MG tablet Take 1 tablet by mouth 2 (two) times daily. For 7 days 14 tablet 0  . lamoTRIgine (LAMICTAL) 25 MG tablet     . methylPREDNISolone (MEDROL DOSEPAK) 4 MG TBPK tablet 6-day pack as directed 21 tablet 0   No current facility-administered medications on file prior to visit.    No Known Allergies    Review of Systems:  Constitutional: No recent illness  Cardiac: No  chest pain, No  pressure  Respiratory:  No  shortness of breath. No  Cough  Gastrointestinal: No  abdominal pain, no change on bowel habits, +GI symptoms as per H{I  Musculoskeletal: No new myalgia/arthralgia  Skin:  No  Rash  Exam:  BP 119/87   Pulse 92   Wt 176 lb (79.8 kg)   BMI 27.57 kg/m   Constitutional: VS see above. General Appearance: alert, well-developed, well-nourished, NAD  Eyes: Normal lids and conjunctive, non-icteric sclera  Ears, Nose, Mouth, Throat: MMM, Normal external inspection ears/nares/mouth/lips/gums.  Neck: No masses, trachea midline.   Respiratory: Normal respiratory effort. no wheeze, no rhonchi, no rales  Cardiovascular: S1/S2 normal, no murmur, no rub/gallop auscultated. RRR.   Musculoskeletal: Gait normal. Symmetric and independent movement of all extremities  Neurological: Normal balance/coordination. No tremor.  Skin: warm, dry, intact.   Psychiatric: Normal judgment/insight. Normal mood and affect. Oriented x3.     ASSESSMENT/PLAN:   Other dysphagia - Plan: Ambulatory referral to Gastroenterology  Annual physical exam - labs ordered for future visit - preventive care not billed today  - Plan: CBC, COMPLETE METABOLIC PANEL WITH GFR, Lipid panel, TSH, VITAMIN D 25 Hydroxy (Vit-D Deficiency, Fractures)      Follow-up plan: Return for ANNUAL PHYSICAL WHEN DUE .  Visit summary with medication list and pertinent instructions was printed for patient to review, alert [place] if any changes needed. All questions at time of visit were answered - patient instructed to contact office with any additional concerns. ER/RTC precautions were reviewed with the patient and understanding verbalized.   Note: Total time spent 15 minutes, greater than 50% of the visit was spent face-to-face counseling and coordinating care for the following: The primary encounter diagnosis was Other dysphagia. A diagnosis of Annual physical exam was also  pertinent to this visit.Marland Kitchen

## 2017-04-15 NOTE — Addendum Note (Signed)
Addended by: Bo Mcclintock C on: 04/15/2017 11:41 AM   Modules accepted: Orders

## 2017-04-29 IMAGING — CR DG CHEST 2V
2 series · 2 of 2 positions shown · non-contrast
Comparison: August 17, 2013

CLINICAL DATA: Recurrent cough.

EXAM:
CHEST  2 VIEW

[chest pa]
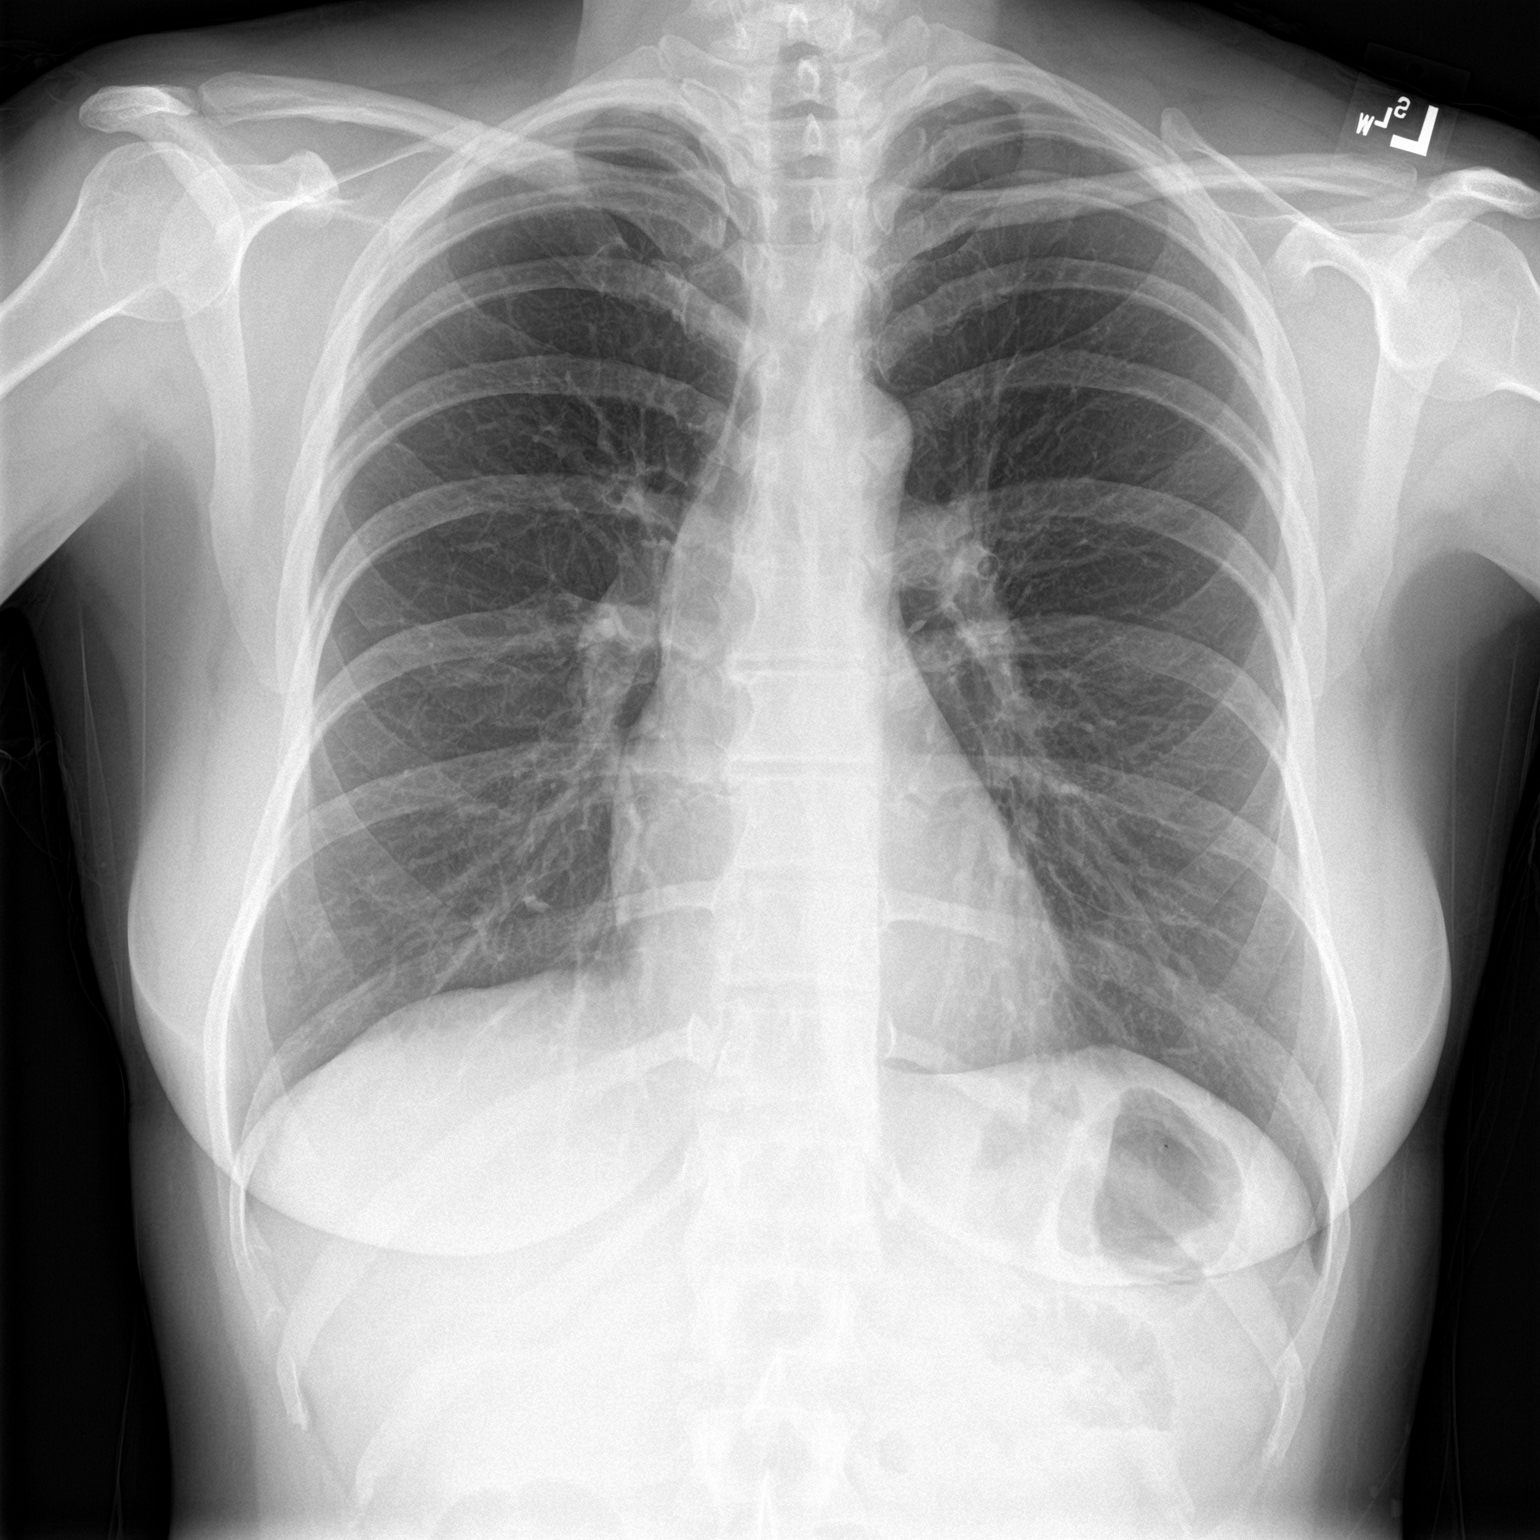

[chest lat]
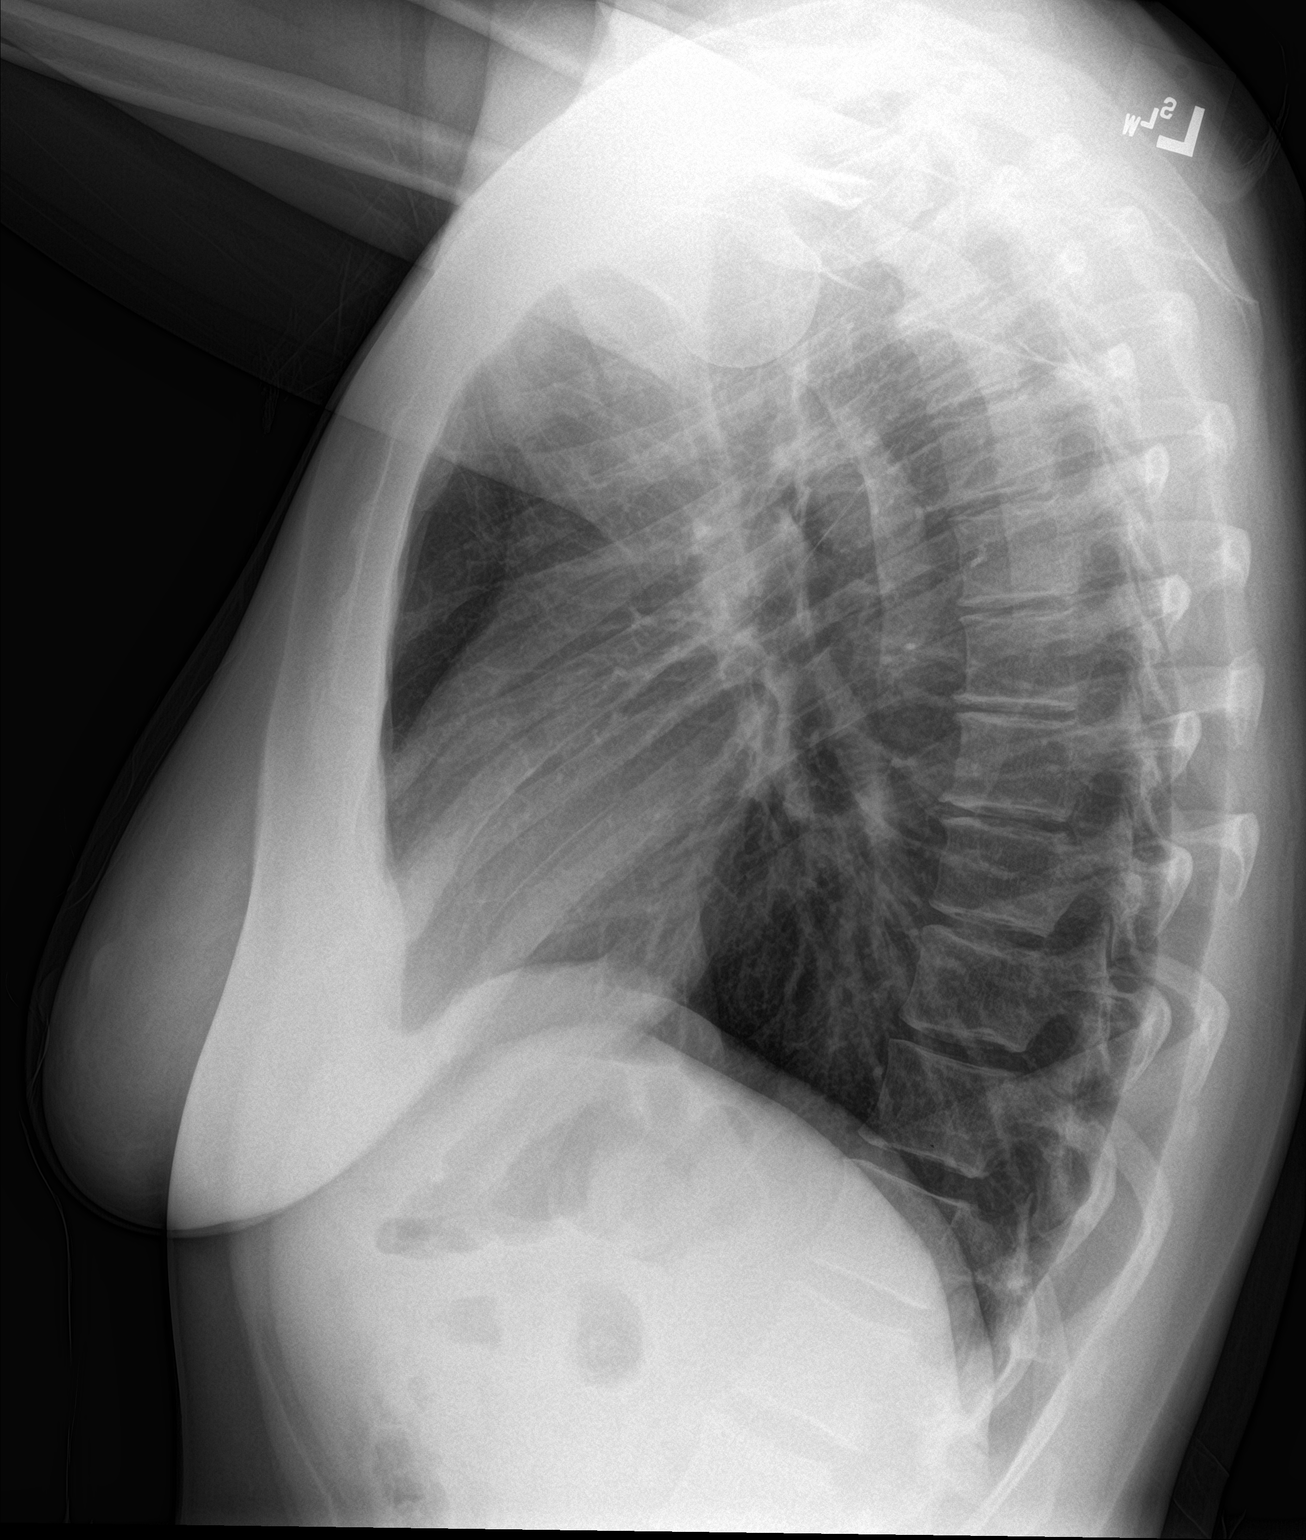

[2 of 2 positions shown; findings below may reference images not displayed]

FINDINGS: The heart size and mediastinal contours are within normal limits.
Both lungs are clear. No pneumothorax or pleural effusion is noted.
The visualized skeletal structures are unremarkable.
IMPRESSION: No active cardiopulmonary disease.

## 2017-05-29 ENCOUNTER — Encounter: Payer: Self-pay | Admitting: Emergency Medicine

## 2017-05-29 ENCOUNTER — Emergency Department (INDEPENDENT_AMBULATORY_CARE_PROVIDER_SITE_OTHER)

## 2017-05-29 ENCOUNTER — Emergency Department (INDEPENDENT_AMBULATORY_CARE_PROVIDER_SITE_OTHER)
Admission: EM | Admit: 2017-05-29 | Discharge: 2017-05-29 | Disposition: A | Discharge status: Home / Self Care | Source: Home / Self Care | Attending: Family Medicine | Admitting: Family Medicine

## 2017-05-29 DIAGNOSIS — J328 Other chronic sinusitis: Secondary | ICD-10-CM | POA: Diagnosis not present

## 2017-05-29 DIAGNOSIS — J0101 Acute recurrent maxillary sinusitis: Secondary | ICD-10-CM | POA: Diagnosis not present

## 2017-05-29 MED ORDER — PREDNISONE 20 MG PO TABS: ORAL_TABLET | ORAL | 0 refills | Status: DC

## 2017-05-29 MED ORDER — AMOXICILLIN-POT CLAVULANATE 875-125 MG PO TABS: 1.0000 | ORAL_TABLET | Freq: Two times a day (BID) | ORAL | 0 refills | Status: DC

## 2017-05-29 NOTE — Discharge Instructions (Signed)
°  You may take 500mg acetaminophen every 4-6 hours or in combination with ibuprofen 400-600mg every 6-8 hours as needed for pain, inflammation, and fever. ° °Be sure to drink at least eight 8oz glasses of water to stay well hydrated and get at least 8 hours of sleep at night, preferably more while sick.  ° °

## 2017-05-29 NOTE — ED Provider Notes (Signed)
[name]    CSN: [id number] Arrival date & time: 05/29/17  0938     History   Chief Complaint Chief Complaint  Patient presents with  . Cough  . Nasal Congestion    HPI Kirsten Herrera is a 41 y.o. female.   HPI  Kirsten Herrera is a 41 y.o. female presenting to UC with c/o 8 days of gradually worsening nasal congestion that started in her chest with productive cough but now she has developed Left sided maxillary sinus tenderness. Hx of recurrent sinus infections.  Last episode was about 6 months ago.  She has seen an ENT in the past who recommended pt get X-rays of her sinuses next time she gets a sinus infection.  Denies fever, chills, n/v/d. She took Dayquil this morning with mild relief.  She has received her flu vaccine this season.     Past Medical History:  Diagnosis Date  . Anxiety   . Asthma   . Seasonal allergies     Patient Active Problem List   Diagnosis Date Noted  . Recurrent pansinusitis 01/02/2017  . Right foot pain 06/12/2016  . Vestibular dizziness involving right inner ear 05/16/2016  . Hypovitaminosis D 04/21/2015  . Chronic fatigue 04/21/2015  . Globus pharyngeus 04/12/2015  . Dysphagia, pharyngoesophageal phase 03/16/2015  . Depression 03/16/2015  . Seasonal allergies 03/16/2015  . Low back pain 04/05/2014    Past Surgical History:  Procedure Laterality Date  . ABDOMINAL SURGERY     c-section x 2   . CESAREAN SECTION    . TUBAL LIGATION    . tuval ligation      OB History    No data available       Home Medications    Prior to Admission medications   Medication Sig Start Date End Date Taking? Authorizing Provider  amitriptyline (ELAVIL) 50 MG tablet One half tab PO qHS for a week, then one tab PO qHS. 06/22/16   Silverio Decamp, MD  amoxicillin-clavulanate (AUGMENTIN) 875-125 MG tablet Take 1 tablet by mouth 2 (two) times daily. One po bid x 7 days 05/29/17   Noe Gens, PA-C  lamoTRIgine (LAMICTAL)  25 MG tablet  05/09/16   [provider]  predniSONE (DELTASONE) 20 MG tablet 3 tabs po day one, then 2 po daily x 4 days 05/29/17   Noe Gens, PA-C    Family History Family History  Problem Relation Age of Onset  . Parkinson's disease Father        Grandmother said Dad had [employer] White Disease   . Cancer Other        throat  . Diabetes Other   . Stroke Other     Social History Social History  Substance Use Topics  . Smoking status: Former Smoker    Quit date: 08/17/2005  . Smokeless tobacco: Never Used  . Alcohol use 0.0 oz/week     Allergies   Patient has no known allergies.   Review of Systems Review of Systems  Constitutional: Negative for chills and fever.  HENT: Positive for congestion, rhinorrhea, sinus pain and sinus pressure. Negative for ear pain, sore throat, trouble swallowing and voice change.   Respiratory: Positive for cough. Negative for shortness of breath.   Cardiovascular: Negative for chest pain and palpitations.  Gastrointestinal: Negative for abdominal pain, diarrhea, nausea and vomiting.  Musculoskeletal: Negative for arthralgias, back pain and myalgias.  Skin: Negative for rash.     Physical  Exam Triage Vital Signs ED Triage Vitals [05/29/17 0931]  Enc Vitals Group     BP 122/85     Pulse Rate 86     Resp 16     Temp 98.2 F (36.8 C)     Temp Source Oral     SpO2 99 %     Weight 175 lb (79.4 kg)     Height 5\' 7"  (1.702 m)     Head Circumference      Peak Flow      Pain Score      Pain Loc      Pain Edu?      Excl. in [place]?    No data found.   Updated Vital Signs BP 122/85 (BP Location: Left Arm)   Pulse 86   Temp 98.2 F (36.8 C) (Oral)   Resp 16   Ht 5\' 7"  (1.702 m)   Wt 175 lb (79.4 kg)   LMP 05/13/2017   SpO2 99%   BMI 27.41 kg/m   Visual Acuity Right Eye Distance:   Left Eye Distance:   Bilateral Distance:    Right Eye Near:   Left Eye Near:    Bilateral Near:     Physical Exam    Constitutional: She is oriented to person, place, and time. She appears well-developed and well-nourished. No distress.  HENT:  Head: Normocephalic and atraumatic.  Right Ear: Tympanic membrane normal.  Left Ear: Tympanic membrane normal.  Nose: Mucosal edema present. Right sinus exhibits no maxillary sinus tenderness and no frontal sinus tenderness. Left sinus exhibits maxillary sinus tenderness. Left sinus exhibits no frontal sinus tenderness.  Mouth/Throat: Uvula is midline, oropharynx is clear and moist and mucous membranes are normal.  Eyes: EOM are normal.  Neck: Normal range of motion.  Cardiovascular: Normal rate and regular rhythm.   Pulmonary/Chest: Effort normal and breath sounds normal. No respiratory distress. She has no wheezes. She has no rales.  Musculoskeletal: Normal range of motion.  Neurological: She is alert and oriented to person, place, and time.  Skin: Skin is warm and dry. She is not diaphoretic.  Psychiatric: She has a normal mood and affect. Her behavior is normal.  Nursing note and vitals reviewed.    UC Treatments / Results  Labs (all labs ordered are listed, but only abnormal results are displayed) Labs Reviewed - No data to display  EKG  EKG Interpretation None       Radiology Dg Sinuses Complete  Result Date: 05/29/2017 CLINICAL DATA:  Recurrent sinus infections. Symptoms over the last 4 weeks without relief. EXAM: PARANASAL SINUSES - COMPLETE 3 + VIEW COMPARISON:  09/23/2013 FINDINGS: Paranasal sinuses appear clear without evidence of opacification or fluid level. CT would be more accurate for minor findings. IMPRESSION: No evidence of sinusitis by radiography. Electronically Signed   By: Nelson Chimes M.D.   On: 05/29/2017 10:37    Procedures Procedures (including critical care time)  Medications Ordered in UC Medications - No data to display   Initial Impression / Assessment and Plan / UC Course  I have reviewed the triage vital signs  and the nursing notes.  Pertinent labs & imaging results that were available during my care of the patient were reviewed by me and considered in my medical decision making (see chart for details).     Hx and exam c/w maxillary sinusitis. Imaging- no evidence of sinusitis, recommend CT f/u with ENT F/u with PCP in 1 week if not improving.  Final Clinical Impressions(s) / UC Diagnoses   Final diagnoses:  Acute recurrent maxillary sinusitis    New Prescriptions Discharge Medication List as of 05/29/2017 10:43 AM    START taking these medications   Details  amoxicillin-clavulanate (AUGMENTIN) 875-125 MG tablet Take 1 tablet by mouth 2 (two) times daily. One po bid x 7 days, Starting Wed 05/29/2017, Normal    predniSONE (DELTASONE) 20 MG tablet 3 tabs po day one, then 2 po daily x 4 days, Normal         Controlled Substance Prescriptions [place] Controlled Substance Registry consulted? Not Applicable   Tyrell Antonio 05/29/17 1201

## 2017-05-29 NOTE — ED Triage Notes (Signed)
Patient states this is day 8 of congestion in head and chest along with cough. Took Day-quil this morning. Has had her flu immunization this season.

## 2017-06-01 ENCOUNTER — Telehealth: Payer: Self-pay | Admitting: Emergency Medicine

## 2017-06-01 NOTE — Telephone Encounter (Signed)
Inquired about patient's status; encourage them to call with questions/concerns.  

## 2017-06-04 ENCOUNTER — Ambulatory Visit (INDEPENDENT_AMBULATORY_CARE_PROVIDER_SITE_OTHER): Admitting: Family Medicine

## 2017-06-04 ENCOUNTER — Encounter: Payer: Self-pay | Admitting: Family Medicine

## 2017-06-04 DIAGNOSIS — M5442 Lumbago with sciatica, left side: Secondary | ICD-10-CM

## 2017-06-04 MED ORDER — DIAZEPAM 5 MG PO TABS: 5.0000 mg | ORAL_TABLET | Freq: Four times a day (QID) | ORAL | 0 refills | Status: DC | PRN

## 2017-06-04 MED ORDER — PREDNISONE 10 MG PO TABS: ORAL_TABLET | ORAL | 0 refills | Status: DC

## 2017-06-04 MED ORDER — OXYCODONE-ACETAMINOPHEN 5-325 MG PO TABS: 1.0000 | ORAL_TABLET | ORAL | 0 refills | Status: DC | PRN

## 2017-06-04 NOTE — Patient Instructions (Signed)
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 Take prednisone dose pack as directed. Percocet as needed for severe pain (no driving on this medicine). Valium as needed for muscle spasms (no driving on this medicine if it makes you sleepy).   Call me in a week to let me know how you're doing.

## 2017-06-05 NOTE — Assessment & Plan Note (Signed)
No red flags.  Consistent with lumbar radiculopathy.  Start prednisone dose pack with percocet and valium as needed.  Home stretches reviewed.  Call us in a week to update [place] on his status - consider MRI if not improving, physical therapy if improving.

## 2017-06-05 NOTE — Progress Notes (Signed)
PCP: Emeterio Reeve, DO  Subjective:   HPI: Patient is a 41 y.o. female here for low back pain.  Patient reports Saturday when setting up for a halloween party she skipped a step coming down stairs and landed hard on her feet, felt a sharp immediate crunch in low back with pain radiating into left leg. No numbness. Pain radiates to mid-shin area. Pain worse with extension. Pain level 8/10 and sharp still, difficulty getting comfortable. No bowel/bladder dysfunction.  Past Medical History:  Diagnosis Date  . Anxiety   . Asthma   . Seasonal allergies     No current outpatient prescriptions on file prior to visit.   No current facility-administered medications on file prior to visit.     Past Surgical History:  Procedure Laterality Date  . ABDOMINAL SURGERY     c-section x 2   . CESAREAN SECTION    . TUBAL LIGATION    . tuval ligation      No Known Allergies  Social History   Social History  . Marital status: Married    Spouse name: N/A  . Number of children: N/A  . Years of education: N/A   Occupational History  . Not on file.   Social History Main Topics  . Smoking status: Former Smoker    Quit date: 08/17/2005  . Smokeless tobacco: Never Used  . Alcohol use 0.0 oz/week  . Drug use: No  . Sexual activity: Yes    Birth control/ protection: None   Other Topics Concern  . Not on file   Social History Narrative  . No narrative on file    Family History  Problem Relation Age of Onset  . Parkinson's disease Father        Grandmother said Dad had [employer] White Disease   . Cancer Other        throat  . Diabetes Other   . Stroke Other     BP (!) 138/91   Pulse 90   Ht 5\' 7"  (1.702 m)   Wt 175 lb (79.4 kg)   LMP 05/13/2017   BMI 27.41 kg/m   Review of Systems: See HPI above.     Objective:  Physical Exam:  Gen: NAD, comfortable in exam room  Back: No gross deformity, scoliosis. TTP left paraspinal lumbar region.  No midline or  bony TTP. FROM with pain on extension. Strength LEs 5/5 all muscle groups.   2+ MSRs in patellar and achilles tendons, equal bilaterally. Negative SLRs. Sensation intact to light touch bilaterally.  Bilateral hips: No gross deformity. No TTP. FROM without pain.  5/5 strength. Negative logroll bilateral hips Negative fabers and piriformis stretches.   Assessment & Plan:  1. Low back pain with radiation into left leg.  No red flags.  Consistent with lumbar radiculopathy.  Start prednisone dose pack with percocet and valium as needed.  Home stretches reviewed.  Call us in a week to update [place] on his status - consider MRI if not improving, physical therapy if improving.

## 2017-06-14 ENCOUNTER — Telehealth: Payer: Self-pay | Admitting: Family Medicine

## 2017-06-14 MED ORDER — HYDROCODONE-ACETAMINOPHEN 7.5-325 MG PO TABS: 1.0000 | ORAL_TABLET | Freq: Four times a day (QID) | ORAL | 0 refills | Status: DC | PRN

## 2017-06-14 NOTE — Telephone Encounter (Signed)
Spoke to patient and she would like to do PT in [place] first. Hold off on MRI at this time. Would also like to do the [place] patient would have to pick up script.

## 2017-06-14 NOTE — Telephone Encounter (Signed)
Database reviewed and script printed.  Thanks!

## 2017-06-14 NOTE — Telephone Encounter (Signed)
Patient feeling better but still in a lot of pain.  Would like to talk about next steps.  Please call.

## 2017-06-14 NOTE — Telephone Encounter (Signed)
Script placed up front °

## 2017-06-14 NOTE — Addendum Note (Signed)
Addended by: Sherrie George F on: 06/14/2017 01:00 PM   Modules accepted: Orders

## 2017-06-19 ENCOUNTER — Ambulatory Visit (INDEPENDENT_AMBULATORY_CARE_PROVIDER_SITE_OTHER): Admitting: Rehabilitative and Restorative Service Providers"

## 2017-06-19 ENCOUNTER — Other Ambulatory Visit: Payer: Self-pay

## 2017-06-19 ENCOUNTER — Encounter: Payer: Self-pay | Admitting: Rehabilitative and Restorative Service Providers"

## 2017-06-19 DIAGNOSIS — R29898 Other symptoms and signs involving the musculoskeletal system: Secondary | ICD-10-CM | POA: Diagnosis not present

## 2017-06-19 DIAGNOSIS — M5416 Radiculopathy, lumbar region: Secondary | ICD-10-CM

## 2017-06-19 DIAGNOSIS — M545 Low back pain: Secondary | ICD-10-CM | POA: Diagnosis not present

## 2017-06-19 NOTE — Patient Instructions (Signed)
Abdominal Bracing With Pelvic Floor (Hook-Lying)    With neutral spine, tighten pelvic floor and abdominals sucking belly button to back bone; tighten muscles in the low back at waist. Hold 10 sec  Repeat _10__ times. Do _several __ times a day.   HIP: Hamstrings - Supine  Place strap around foot. Raise leg up, keeping knee straight.  Bend opposite knee to protect back if indicated. Hold 30 seconds. 3 reps per set, 2-3 sets per day  Outer Hip Stretch: Reclined IT Band Stretch (Strap)   Strap around one foot, pull leg across body until you feel a pull or stretch in the outside of your hip, with shoulders on mat. Hold for 30 seconds. Repeat 3 times each leg. 2-3 times/day.  Piriformis Stretch   Lying on back, pull right knee toward opposite shoulder. Hold 30 seconds. Repeat 3 times. Do 2-3 sessions per day.    WALKING  Walking is a great form of exercise to increase your strength, endurance and overall fitness.  A walking program can help you start slowly and gradually build endurance as you go.  Everyone's ability is different, so each person's starting point will be different.  You do not have to follow them exactly.  The are just samples. You should simply find out what's right for you and stick to that program.   In the beginning, you'll start off walking 2-3 times a day for short distances.  As you get stronger, you'll be walking further at just 1-2 times per day.  A. You Can Walk For A Certain Length Of Time Each Day    Walk 5 minutes 3 times per day.  Increase 2 minutes every 2 days (3 times per day).  Work up to 25-30 minutes (1-2 times per day).   Example:   Day 1-2 5 minutes 3 times per day   Day 7-8 12 minutes 2-3 times per day   Day 13-14 25 minutes 1-2 times per day  B. You Can Walk For a Certain Distance Each Day     Distance can be substituted for time.    Example:   3 trips to mailbox (at road)   3 trips to corner of block   3 trips around the  block  C. Go to local [employer] school and use the track.    Walk for distance ____ around track  Or time ____ minutes

## 2017-06-19 NOTE — Therapy (Signed)
[place], [place], [address] Phone: [phone]   Fax:  [phone]  Physical Therapy Evaluation  Patient Details  Name: Kirsten Herrera MRN: [id number] Date of Birth: 1975-09-01 Referring Provider: Dr Karlton Lemon   Encounter Date: 06/19/2017  PT End of Session - 06/19/17 1411    Visit Number  1    Number of Visits  12    Date for PT Re-Evaluation  07/31/17    PT Start Time  4097    PT Stop Time  1510    PT Time Calculation (min)  59 min    Activity Tolerance  Patient tolerated treatment well       Past Medical History:  Diagnosis Date  . Anxiety   . Asthma   . Seasonal allergies     Past Surgical History:  Procedure Laterality Date  . ABDOMINAL SURGERY     c-section x 2   . CESAREAN SECTION    . TUBAL LIGATION    . tuval ligation      There were no vitals filed for this visit.   Subjective Assessment - 06/19/17 1419    Subjective  Patient reports that she was running down the stairs when she skipped the past two steps landing hard on the floor at the foot of the steps 06/01/17. she was seen by MD 06/04/17 and diagnosed with HNP. Treated with meds and symtpoms have improved.     Pertinent History  Chronic LBP over ~ 20 years after falling down a flight of stairs at 41 yr old;     How long can you sit comfortably?  1-2 hours     How long can you stand comfortably?  45-60 min     How long can you walk comfortably?  3/4 mile ~ 45 min slow pace     Patient Stated Goals  get rid og the LBP and learn exercises     Currently in Pain?  Yes    Pain Score  2     Pain Location  Back    Pain Orientation  Lower;Left    Pain Descriptors / Indicators  Dull    Pain Type  Acute pain;Chronic pain    Pain Radiating Towards  has been into the posterior Lt thigh less noticable now compared to initial injury     Pain Onset  1 to 4 weeks ago    Pain Frequency  Intermittent    Aggravating  Factors   prolonged standing or sitting; bending    Pain Relieving Factors  meds; ice; heat; lying down with legs elevated          [facility] PT Assessment - 06/19/17 0001      Assessment   Medical Diagnosis  LBP     Referring Provider  Dr Karlton Lemon    Onset Date/Surgical Date  06/01/17    Hand Dominance  Right    Next MD Visit  PRN     Prior Therapy  yes 2017 for LBP       Precautions   Precautions  None      Balance Screen   Has the patient fallen in the past 6 months  No slipped off a step landing hard on feet - no fall     Has the patient had a decrease in activity level because of a fear of falling?   No    Is the patient reluctant to leave their home because of  a fear of falling?   No      Prior Function   Level of Independence  Independent    Vocation  Self employed hours vary     Vocation Requirements  sewing various items     Leisure  household chores; 74 and 41 year old boys; gym 5 days/wk cardio modify to avoid jumping; does lifting classes       Observation/Other Assessments   Focus on Therapeutic Outcomes (FOTO)   35% limitation       Sensation   Additional Comments  WFL's per pt report       Posture/Postural Control   Posture Comments  head forward shoudlers rounded       AROM   AROM Assessment Site  -- some discomfort in L LB with flexion and Rt lateral flexion     Lumbar Flexion  85%    Lumbar Extension  65%    Lumbar - Right Side Bend  80%    Lumbar - Left Side Bend  85%    Lumbar - Right Rotation  50%    Lumbar - Left Rotation  45%      Strength   Overall Strength Comments  5/5 bilat LE's       Flexibility   Hamstrings  tight Lt ~ 75 deg; Rt 80 deg     Quadriceps  WFL's     ITB  tight Lt > Rt     Piriformis  tight Lt       Palpation   Spinal mobility  pain with spring testing Lower lumbar to sacrum     Palpation comment  tender and tight Lt lateral sacral border; Lt piriformis; Lt psoas/hip flexors              Objective  measurements completed on examination: See above findings.      [facility] Adult PT Treatment/Exercise - 06/19/17 0001      Therapeutic Activites    Therapeutic Activities  -- myofacial ball release work hip flexors/posterior hip Lt      Lumbar Exercises: Stretches   Passive Hamstring Stretch  2 reps;30 seconds supine with strap     Hip Flexor Stretch  3 reps;30 seconds seated     ITB Stretch  2 reps;30 seconds supine with strap     Piriformis Stretch  3 reps;30 seconds supine travell varying angles       Lumbar Exercises: Supine   Other Supine Lumbar Exercises  3 part core 10 sec x 10       Moist Heat Therapy   Number Minutes Moist Heat  20 Minutes    Moist Heat Location  Lumbar Spine;Hip Lt psoas       Electrical Stimulation   Electrical Stimulation Location  Lt lumbar to posterior hip     Electrical Stimulation Action  IFC    Electrical Stimulation Parameters  to tolerance    Electrical Stimulation Goals  Pain;Tone muscle tightness              PT Education - 06/19/17 1459    Education provided  Yes    Education Details  HEP     Person(s) Educated  Patient    Methods  Explanation;Demonstration;Tactile cues;Verbal cues;Handout    Comprehension  Verbalized understanding;Returned demonstration;Verbal cues required;Tactile cues required          PT Long Term Goals - 06/19/17 1409      PT LONG TERM GOAL #1   Title  Improve core stability  progressing to appropriate program for home and work  07/31/17    Time  6    Period  Weeks    Status  New      PT LONG TERM GOAL #2   Title  Instruct pt in good body mechanics and back care with pt to demo good techniques in clinic and report imporoved mechanics at home 07/31/17    Time  6    Period  Weeks    Status  New      PT LONG TERM GOAL #3   Title  Patient reports ability to sit/stand/walk for functional activities with minimal to no pain 07/31/17    Time  6    Period  Weeks    Status  New      PT LONG TERM GOAL #4    Title  I in HEP 07/31/17    Time  6    Period  Weeks    Status  New      PT LONG TERM GOAL #5   Title  Improve FOTO to </= 23% limitation 07/31/17    Time  6    Period  Weeks    Status  New             Plan - 06/19/17 1503    Clinical Impression Statement  Kirsten Herrera presents with Lt lumbar pain with Lt LE radiculopathy following injury 06/01/17. She has improved with rest and medication but continues to have intermittent pain in the Lt LB and posterior Lt thigh. Patient has tightness through the lumbar spine as well sa Lt hip musculature posteriorly and anteriorly. She will benefit form PT to address problems identified.     Clinical Presentation  Stable    Clinical Presentation due to:  recurrent LBP and Lt LE radicular pain over the past ~ 20 years     Clinical Decision Making  Low    Rehab Potential  Good    PT Frequency  2x / week    PT Duration  6 weeks    PT Treatment/Interventions  Patient/family education;ADLs/Self Care Home Management;Cryotherapy;Electrical Stimulation;Iontophoresis 4mg /ml Dexamethasone;Moist Heat;Traction;Ultrasound;Dry needling;Manual techniques;Neuromuscular re-education;Therapeutic activities;Therapeutic exercise    PT Next Visit Plan  review HEP; progress with core stabilization; manual work Lt psoas/posterior hip; modalities as indicated     Consulted and Agree with Plan of Care  Patient       Patient will benefit from skilled therapeutic intervention in order to improve the following deficits and impairments:  Postural dysfunction, Improper body mechanics, Increased fascial restricitons, Increased muscle spasms, Decreased mobility, Decreased range of motion, Decreased activity tolerance  Visit Diagnosis: Acute bilateral low back pain, with sciatica presence unspecified - Plan: PT plan of care cert/re-cert  Lumbar radiculopathy - Plan: PT plan of care cert/re-cert  Other symptoms and signs involving the musculoskeletal system - Plan: PT plan of  care cert/re-cert     Problem List Patient Active Problem List   Diagnosis Date Noted  . Recurrent pansinusitis 01/02/2017  . Right foot pain 06/12/2016  . Vestibular dizziness involving right inner ear 05/16/2016  . Hypovitaminosis D 04/21/2015  . Chronic fatigue 04/21/2015  . Globus pharyngeus 04/12/2015  . Dysphagia, pharyngoesophageal phase 03/16/2015  . Depression 03/16/2015  . Seasonal allergies 03/16/2015  . Low back pain 04/05/2014    Gustava Berland Nilda Simmer PT, MPH  06/19/2017, 3:10 PM  [facility] [place], [place], [address] Phone: [phone]   Fax:  [phone]  Name: Kirsten Herrera MRN: [id number] Date of Birth: [birth year]

## 2017-06-25 ENCOUNTER — Ambulatory Visit (INDEPENDENT_AMBULATORY_CARE_PROVIDER_SITE_OTHER): Admitting: Physical Therapy

## 2017-06-25 DIAGNOSIS — R29898 Other symptoms and signs involving the musculoskeletal system: Secondary | ICD-10-CM | POA: Diagnosis not present

## 2017-06-25 DIAGNOSIS — R293 Abnormal posture: Secondary | ICD-10-CM

## 2017-06-25 DIAGNOSIS — M545 Low back pain: Secondary | ICD-10-CM

## 2017-06-25 DIAGNOSIS — M5416 Radiculopathy, lumbar region: Secondary | ICD-10-CM

## 2017-06-25 NOTE — Patient Instructions (Signed)
Pelvic Press     Place hands under belly between navel and pubic bone, palms up. Feel pressure on hands. Increase pressure on hands by pressing pelvis down. This is NOT a pelvic tilt. Hold __5_ seconds. Relax. Repeat _10__ times. Once a day.  KNEE: Flexion - Prone   Hold pelvic press. Bend knee, then return the foot down. Repeat on opposite leg. Do not raise hips. _10__ reps per set. When this is mastered, pull both heels up at same time, x 10 reps.  Once a day   Leg Lift: One-Leg   Press pelvis down. Keep knee straight; lengthen and lift one leg (from waist). Do not twist body. Keep other leg down. Hold _1__ seconds. Relax. Repeat 10 time. Repeat with other leg.  HIP: Extension / KNEE: Flexion - Prone    Hold pelvic press. Bend knee, squeeze glutes. Raise leg up  10___ reps per set, _1__ sets per day, _1__ time a day.    [facility] [place] [address], [place] [address]  [phone] (office) [phone] (fax)

## 2017-06-25 NOTE — Therapy (Signed)
[place], [place], [address] Phone: [phone]   Fax:  [phone]  Physical Therapy Treatment  Patient Details  Name: Kirsten Herrera MRN: [id number] Date of Birth: 1975/09/23 Referring Provider: Dr. Karlton Lemon   Encounter Date: 06/25/2017  PT End of Session - 06/25/17 1407    Visit Number  2    Number of Visits  12    Date for PT Re-Evaluation  07/31/17    PT Start Time  1406    PT Stop Time  1453    PT Time Calculation (min)  47 min    Activity Tolerance  Patient tolerated treatment well    Behavior During Therapy  [facility] for tasks assessed/performed       Past Medical History:  Diagnosis Date  . Anxiety   . Asthma   . Seasonal allergies     Past Surgical History:  Procedure Laterality Date  . ABDOMINAL SURGERY     c-section x 2   . CESAREAN SECTION    . TUBAL LIGATION    . tuval ligation      There were no vitals filed for this visit.  Subjective Assessment - 06/25/17 1408    Subjective  Pt was running around her son's school and the pain has returned. Other than that her back has really calmed down.  She has been walking dogs and working out without much difficulty     Currently in Pain?  Yes    Pain Score  3     Pain Location  Back    Pain Orientation  Lower;Left    Pain Descriptors / Indicators  Aching;Dull    Aggravating Factors   prolonged positions, running    Pain Relieving Factors  heat, ice, meds, legs elevated when on back.          [facility] PT Assessment - 06/25/17 0001      Assessment   Medical Diagnosis  LBP     Referring Provider  Dr. Karlton Lemon    Onset Date/Surgical Date  06/01/17    Hand Dominance  Right    Next MD Visit  PRN         [facility] Adult PT Treatment/Exercise - 06/25/17 0001      Self-Care   Self-Care  Other Self-Care Comments    Other Self-Care Comments   Pt educated on engaging core muscles with position changes and self massage  with ball to psoas. Pt returned demo and verbalized understanding.       Lumbar Exercises: Stretches   Passive Hamstring Stretch  2 reps;30 seconds supine with strap     Hip Flexor Stretch  3 reps;30 seconds seated     Piriformis Stretch  3 reps;30 seconds supine travell varying angles       Lumbar Exercises: Prone   Other Prone Lumbar Exercises  pelvic press x 5 sec, 5 reps; pelvic press with unilateral knee bends x 5 each leg, then hip ext (straight knee, then bent knee) x 5 each leg      Moist Heat Therapy   Number Minutes Moist Heat  20 Minutes    Moist Heat Location  Lumbar Spine;Hip Lt psoas       Electrical Stimulation   Electrical Stimulation Location  Lt lumbar to posterior hip     Electrical Stimulation Action  IFC    Electrical Stimulation Parameters  to tolerance     Electrical Stimulation Goals  Pain;Tone muscle tightness  PT Long Term Goals - 06/25/17 1424      PT LONG TERM GOAL #1   Title  Improve core stability progressing to appropriate program for home and work  07/31/17    Time  6    Period  Weeks    Status  On-going      PT LONG TERM GOAL #2   Title  Instruct pt in good body mechanics and back care with pt to demo good techniques in clinic and report imporoved mechanics at home 07/31/17    Time  6    Period  Weeks    Status  On-going      PT LONG TERM GOAL #3   Title  Patient reports ability to sit/stand/walk for functional activities with minimal to no pain 07/31/17    Time  6    Period  Weeks    Status  On-going      PT LONG TERM GOAL #4   Title  I in HEP 07/31/17    Time  6    Period  Weeks    Status  On-going      PT LONG TERM GOAL #5   Title  Improve FOTO to </= 23% limitation 07/31/17    Time  6    Period  Weeks    Status  On-going            Plan - 06/25/17 1652    Clinical Impression Statement  Pt had positive response to last treatment.  Pt given additional core strengthening exercises to incorporate into routine and  was encouraged to not overdo it while back is healing.  Pt reported reduction in LBP at end of session. Pt is progressing towards goals.     Rehab Potential  Good    PT Frequency  2x / week    PT Duration  6 weeks    PT Treatment/Interventions  Patient/family education;ADLs/Self Care Home Management;Cryotherapy;Electrical Stimulation;Iontophoresis 4mg /ml Dexamethasone;Moist Heat;Traction;Ultrasound;Dry needling;Manual techniques;Neuromuscular re-education;Therapeutic activities;Therapeutic exercise    PT Next Visit Plan  review HEP; progress with core stabilization; manual work Lt psoas/posterior hip; modalities as indicated     Consulted and Agree with Plan of Care  Patient       Patient will benefit from skilled therapeutic intervention in order to improve the following deficits and impairments:  Postural dysfunction, Improper body mechanics, Increased fascial restricitons, Increased muscle spasms, Decreased mobility, Decreased range of motion, Decreased activity tolerance  Visit Diagnosis: Acute bilateral low back pain, with sciatica presence unspecified  Lumbar radiculopathy  Other symptoms and signs involving the musculoskeletal system  Abnormal posture     Problem List Patient Active Problem List   Diagnosis Date Noted  . Recurrent pansinusitis 01/02/2017  . Right foot pain 06/12/2016  . Vestibular dizziness involving right inner ear 05/16/2016  . Hypovitaminosis D 04/21/2015  . Chronic fatigue 04/21/2015  . Globus pharyngeus 04/12/2015  . Dysphagia, pharyngoesophageal phase 03/16/2015  . Depression 03/16/2015  . Seasonal allergies 03/16/2015  . Low back pain 04/05/2014     Kerin Perna, PTA 06/25/17 4:53 PM  [facility] [place], [place], [address] Phone: [phone]   Fax:  [phone]  Name: Kirsten Herrera MRN: [id number] Date of Birth: Jun 21, 1976

## 2017-07-08 ENCOUNTER — Encounter: Payer: Self-pay | Admitting: Family Medicine

## 2017-07-08 ENCOUNTER — Ambulatory Visit (INDEPENDENT_AMBULATORY_CARE_PROVIDER_SITE_OTHER): Admitting: Family Medicine

## 2017-07-08 DIAGNOSIS — M5442 Lumbago with sciatica, left side: Secondary | ICD-10-CM | POA: Diagnosis not present

## 2017-07-08 MED ORDER — OXYCODONE-ACETAMINOPHEN 5-325 MG PO TABS: 1.0000 | ORAL_TABLET | ORAL | 0 refills | Status: DC | PRN

## 2017-07-08 MED ORDER — PREDNISONE 10 MG PO TABS: ORAL_TABLET | ORAL | 0 refills | Status: DC

## 2017-07-08 MED ORDER — DIAZEPAM 5 MG PO TABS: 5.0000 mg | ORAL_TABLET | Freq: Four times a day (QID) | ORAL | 0 refills | Status: DC | PRN

## 2017-07-08 NOTE — Patient Instructions (Addendum)
You have lumbar radiculopathy (disc herniation causing nerve irritation) Take prednisone dose pack as directed. Percocet as needed for severe pain (no driving on this medicine). Valium as needed for muscle spasms (no driving on this medicine if it makes you sleepy).   Keep your physical therapy appointments. Do home exercises/stretches as tolerated. Let me know if you want to go ahead with an MRI of your lumbar spine.

## 2017-07-09 ENCOUNTER — Encounter: Payer: Self-pay | Admitting: Family Medicine

## 2017-07-09 NOTE — Assessment & Plan Note (Signed)
consistent with lumbar radiculopathy again with no red flags.  Repeat prednisone dose pack, percocet, and valium as needed.  Keep therapy appointments.  She is going to check into insurance and let me know if she wants to go ahead with MRI.  Concerning with several flare-ups and discussed risks of repeat prednisone courses and narcotics.

## 2017-07-09 NOTE — Progress Notes (Signed)
PCP: Emeterio Reeve, DO  Subjective:   HPI: Patient is a 41 y.o. female here for low back pain.  10/30: Patient reports Saturday when setting up for a halloween party she skipped a step coming down stairs and landed hard on her feet, felt a sharp immediate crunch in low back with pain radiating into left leg. No numbness. Pain radiates to mid-shin area. Pain worse with extension. Pain level 8/10 and sharp still, difficulty getting comfortable. No bowel/bladder dysfunction.  12/3: Patient reports she has done a couple visits of therapy, focusing on doing home exercises and stretches as well. On 11/29 she bent over to pick up a toy, tried to stand up and couldn't due to pain in low back. Went down both legs yesterday and today only into right leg. Pain level 7/10 and sharp. Difficulty to get comfortable, sit, or sleep. Taking ibuprofen and using heat and ice. No bowel/bladder dysfunction. No numbness, skin changes.  Past Medical History:  Diagnosis Date  . Anxiety   . Asthma   . Seasonal allergies     Current Outpatient Medications on File Prior to Visit  Medication Sig Dispense Refill  . amitriptyline (ELAVIL) 10 MG tablet     . lamoTRIgine (LAMICTAL) 100 MG tablet      No current facility-administered medications on file prior to visit.     Past Surgical History:  Procedure Laterality Date  . ABDOMINAL SURGERY     c-section x 2   . CESAREAN SECTION    . TUBAL LIGATION    . tuval ligation      No Known Allergies  Social History   Socioeconomic History  . Marital status: Married    Spouse name: Not on file  . Number of children: Not on file  . Years of education: Not on file  . Highest education level: Not on file  Social Needs  . Financial resource strain: Not on file  . Food insecurity - worry: Not on file  . Food insecurity - inability: Not on file  . Transportation needs - medical: Not on file  . Transportation needs - non-medical: Not on file   Occupational History  . Not on file  Tobacco Use  . Smoking status: Former Smoker    Last attempt to quit: 08/17/2005    Years since quitting: 11.9  . Smokeless tobacco: Never Used  Substance and Sexual Activity  . Alcohol use: Yes    Alcohol/week: 0.0 oz  . Drug use: No  . Sexual activity: Yes    Birth control/protection: None  Other Topics Concern  . Not on file  Social History Narrative  . Not on file    Family History  Problem Relation Age of Onset  . Parkinson's disease Father        Grandmother said Dad had [employer] White Disease   . Cancer Other        throat  . Diabetes Other   . Stroke Other     BP 121/89   Pulse 75   Ht 5\' 7"  (1.702 m)   Wt 175 lb (79.4 kg)   BMI 27.41 kg/m   Review of Systems: See HPI above.     Objective:  Physical Exam:  Gen: NAD, comfortable in exam room.  Back: No gross deformity, scoliosis. TTP bilateral paraspinal lumbar regions.  No midline or bony TTP. FROM with pain on flexion and extension. Strength LEs 5/5 all muscle groups.   2+ MSRs in patellar and achilles tendons,  equal bilaterally. Negative SLRs. Sensation intact to light touch bilaterally.  Bilateral hips: No deformity. No tenderness. FROM without pain and 5/5 strength. Negative logroll bilateral hips. NVI distally.   Assessment & Plan:  1. Low back pain with radiation into legs - consistent with lumbar radiculopathy again with no red flags.  Repeat prednisone dose pack, percocet, and valium as needed.  Keep therapy appointments.  She is going to check into insurance and let me know if she wants to go ahead with MRI.  Concerning with several flare-ups and discussed risks of repeat prednisone courses and narcotics.

## 2017-07-10 ENCOUNTER — Ambulatory Visit (INDEPENDENT_AMBULATORY_CARE_PROVIDER_SITE_OTHER): Admitting: Rehabilitative and Restorative Service Providers"

## 2017-07-10 ENCOUNTER — Encounter: Payer: Self-pay | Admitting: Rehabilitative and Restorative Service Providers"

## 2017-07-10 DIAGNOSIS — R293 Abnormal posture: Secondary | ICD-10-CM

## 2017-07-10 DIAGNOSIS — R29898 Other symptoms and signs involving the musculoskeletal system: Secondary | ICD-10-CM

## 2017-07-10 DIAGNOSIS — M545 Low back pain: Secondary | ICD-10-CM

## 2017-07-10 DIAGNOSIS — M5416 Radiculopathy, lumbar region: Secondary | ICD-10-CM

## 2017-07-10 NOTE — Therapy (Signed)
[place], [place], [address] Phone: [phone]   Fax:  [phone]  Physical Therapy Treatment  Patient Details  Name: Kirsten Herrera MRN: [id number] Date of Birth: Dec 09, 1975 Referring Provider: Dr Karlton Lemon    Encounter Date: 07/10/2017  PT End of Session - 07/10/17 1434    Visit Number  3    Number of Visits  12    Date for PT Re-Evaluation  07/31/17    PT Start Time  1601    PT Stop Time  1529    PT Time Calculation (min)  56 min    Activity Tolerance  Patient tolerated treatment well       Past Medical History:  Diagnosis Date  . Anxiety   . Asthma   . Seasonal allergies     Past Surgical History:  Procedure Laterality Date  . ABDOMINAL SURGERY     c-section x 2   . CESAREAN SECTION    . TUBAL LIGATION    . tuval ligation      There were no vitals filed for this visit.  Subjective Assessment - 07/10/17 1435    Subjective  Patient reports that she was bendeing forward to pick up a toy 07/04/17 when she experienced severe lumbar spasm and pain into bilat LE's. She started with poredisone 3 days ago and has had some improvement in the symptoms since re-injury.     Currently in Pain?  Yes    Pain Score  3     Pain Location  Back    Pain Orientation  Lower;Right;Left    Pain Descriptors / Indicators  Aching;Dull;Sharp down legs - sharp     Pain Type  Acute pain;Chronic pain    Aggravating Factors   prolonged positions; running    Pain Relieving Factors  predisone; heat; ice; meds; rest          [facility] PT Assessment - 07/10/17 0001      Assessment   Medical Diagnosis  LBP     Referring Provider  Dr Karlton Lemon     Onset Date/Surgical Date  06/01/17    Hand Dominance  Right    Next MD Visit  PRN       AROM   Lumbar Flexion  75%    Lumbar Extension  50%    Lumbar - Right Side Bend  75%    Lumbar - Left Side Bend  70%    Lumbar - Right Rotation  45%    Lumbar  - Left Rotation  40%      Flexibility   Hamstrings  tight Lt ~ 75 deg; Rt 80 deg     Quadriceps  WFL's     ITB  tight Lt > Rt     Piriformis  tight Lt       Palpation   Spinal mobility  pain with spring testing Lower lumbar to sacrum     SI assessment   pain with palpation bilat     Palpation comment  tender and tight Lt lateral sacral border; Lt piriformis; Lt psoas/hip flexors                   [facility] Adult PT Treatment/Exercise - 07/10/17 0001      Lumbar Exercises: Stretches   Passive Hamstring Stretch  2 reps;30 seconds supine with strap     Press Ups  -- 1-2 sec x 10 reps     Piriformis Stretch  3 reps;30 seconds supine travell varying angles - gentle to pt tolerance       Lumbar Exercises: Supine   Other Supine Lumbar Exercises  3 part core 10 sec x 10       Moist Heat Therapy   Number Minutes Moist Heat  20 Minutes    Moist Heat Location  Lumbar Spine;Hip Lt psoas       Electrical Stimulation   Electrical Stimulation Location  bilat lumbar to posterior hip     Electrical Stimulation Action  IFC    Electrical Stimulation Parameters  to tolerance    Electrical Stimulation Goals  Pain;Tone muscle tightness       Manual Therapy   Manual therapy comments  pt prone     Joint Mobilization  Grade II CPA and gentle lateral mobs lumbar spine     Soft tissue mobilization  working through lumbar spine musculature; bilat lumbar paraspinals and QL/lats into the hip musculature     Myofascial Release  lumbar/hip musculature        Trigger Point Dry Needling - 07/10/17 1514    Consent Given?  Yes    Education Handout Provided  Yes    Muscles Treated Lower Body  -- bilat with estim     Longissimus Response  Palpable increased muscle length    Gluteus Maximus Response  Palpable increased muscle length    Gluteus Minimus Response  Palpable increased muscle length    Piriformis Response  Palpable increased muscle length                PT Long Term Goals -  07/10/17 1653      PT LONG TERM GOAL #1   Title  Improve core stability progressing to appropriate program for home and work  07/31/17    Time  6    Period  Weeks    Status  On-going      PT LONG TERM GOAL #2   Title  Instruct pt in good body mechanics and back care with pt to demo good techniques in clinic and report imporoved mechanics at home 07/31/17    Time  6    Period  Weeks    Status  On-going      PT LONG TERM GOAL #3   Title  Patient reports ability to sit/stand/walk for functional activities with minimal to no pain 07/31/17    Time  6    Period  Weeks    Status  On-going      PT LONG TERM GOAL #4   Title  I in HEP 07/31/17    Time  6    Period  Weeks    Status  On-going      PT LONG TERM GOAL #5   Title  Improve FOTO to </= 23% limitation 07/31/17    Time  6    Period  Weeks    Status  On-going            Plan - 07/10/17 1650    Clinical Impression Statement  Kirsten Herrera returns with significant flare up of symptoms when she bent over to pick up a toy 07/04/17 and felt sever spasms and bilat posterior hip and LE pain. She has started on predisone dose pack with some improvement. Patient presents today with decresaed trunk and LE mobilty, muscular tightness to palpation through the lumbar and hip musculature. She responded well to manual work and DN. Will begin core work and stabilization as tolerated. Goals  remain appropriate.     Rehab Potential  Good    PT Frequency  2x / week    PT Duration  6 weeks    PT Treatment/Interventions  Patient/family education;ADLs/Self Care Home Management;Cryotherapy;Electrical Stimulation;Iontophoresis 4mg /ml Dexamethasone;Moist Heat;Traction;Ultrasound;Dry needling;Manual techniques;Neuromuscular re-education;Therapeutic activities;Therapeutic exercise    PT Next Visit Plan  review HEP; progress with core stabilization; manual work Lt psoas/posterior hip; modalities as indicated assess response to DN     Consulted and Agree with  Plan of Care  Patient       Patient will benefit from skilled therapeutic intervention in order to improve the following deficits and impairments:  Postural dysfunction, Improper body mechanics, Increased fascial restricitons, Increased muscle spasms, Decreased mobility, Decreased range of motion, Decreased activity tolerance  Visit Diagnosis: Acute bilateral low back pain, with sciatica presence unspecified  Lumbar radiculopathy  Other symptoms and signs involving the musculoskeletal system  Abnormal posture     Problem List Patient Active Problem List   Diagnosis Date Noted  . Recurrent pansinusitis 01/02/2017  . Right foot pain 06/12/2016  . Vestibular dizziness involving right inner ear 05/16/2016  . Hypovitaminosis D 04/21/2015  . Chronic fatigue 04/21/2015  . Globus pharyngeus 04/12/2015  . Dysphagia, pharyngoesophageal phase 03/16/2015  . Depression 03/16/2015  . Seasonal allergies 03/16/2015  . Low back pain 04/05/2014    Adith Tejada Nilda Simmer PT, MPH  07/10/2017, 4:55 PM  [facility] [place], [place], [address] Phone: [phone]   Fax:  [phone]  Name: Kirsten Herrera MRN: [id number] Date of Birth: 1976/03/14

## 2017-07-17 ENCOUNTER — Encounter: Admitting: Rehabilitative and Restorative Service Providers"

## 2017-07-24 ENCOUNTER — Encounter: Payer: Self-pay | Admitting: Rehabilitative and Restorative Service Providers"

## 2017-07-24 ENCOUNTER — Ambulatory Visit (INDEPENDENT_AMBULATORY_CARE_PROVIDER_SITE_OTHER): Admitting: Rehabilitative and Restorative Service Providers"

## 2017-07-24 DIAGNOSIS — R29898 Other symptoms and signs involving the musculoskeletal system: Secondary | ICD-10-CM | POA: Diagnosis not present

## 2017-07-24 DIAGNOSIS — R293 Abnormal posture: Secondary | ICD-10-CM

## 2017-07-24 DIAGNOSIS — M5416 Radiculopathy, lumbar region: Secondary | ICD-10-CM

## 2017-07-24 DIAGNOSIS — M545 Low back pain: Secondary | ICD-10-CM

## 2017-07-24 NOTE — Therapy (Addendum)
[place], [place], [address] Phone: [phone]   Fax:  [phone]  Physical Therapy Treatment  Patient Details  Name: Kirsten Herrera MRN: [id number] Date of Birth: [birth year] Referring Provider: Dr Karlton Lemon   Encounter Date: 07/24/2017    Past Medical History:  Diagnosis Date  . Anxiety   . Asthma   . Seasonal allergies     Past Surgical History:  Procedure Laterality Date  . ABDOMINAL SURGERY     c-section x 2   . CESAREAN SECTION    . TUBAL LIGATION    . tuval ligation      There were no vitals filed for this visit.  Subjective Assessment - 07/24/17 1110    Subjective  Good improvement - minimal pain. 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 Has had some pain in the Rt hip area but that has resolved. Good response to DN and would like to repeat that.     Currently in Pain?  No/denies         [facility] PT Assessment - 07/24/17 0001      Assessment   Medical Diagnosis  LBP     Referring Provider  Dr Karlton Lemon    Onset Date/Surgical Date  06/01/17    Hand Dominance  Right    Next MD Visit  PRN       Flexibility   Piriformis  tight Lt       Palpation   Spinal mobility  pain with spring testing Lower lumbar to sacrum     SI assessment   pain with palpation bilat     Palpation comment  tender and tight Lt lateral sacral border; Lt piriformis; Lt psoas/hip flexors; bilat posterior hips                   [facility] Adult PT Treatment/Exercise - 07/24/17 0001      Lumbar Exercises: Standing   Wall Slides  20 reps;5 seconds    Row  Strengthening;Both;20 reps;Theraband step back one side with row x 20 each side     Theraband Level (Row)  Level 3 (Green)    Shoulder Extension  Strengthening;Both;20 reps;Theraband    Theraband Level (Shoulder Extension)  Level 3 (Green)    Other Standing Lumbar Exercises  heel tap 4 inch step x 20 each  LE       Lumbar Exercises: Supine   Other Supine Lumbar Exercises  3 part core 10 sec x 10       Moist Heat Therapy   Number Minutes Moist Heat  20 Minutes    Moist Heat Location  Lumbar Spine;Hip Lt psoas       Electrical Stimulation   Electrical Stimulation Location  bilat lumbar to posterior hip     Electrical Stimulation Action  IFC    Electrical Stimulation Parameters  to tolerance    Electrical Stimulation Goals  Pain;Tone muscle tightness       Manual Therapy   Manual therapy comments  pt prone     Joint Mobilization  Grade II CPA and gentle lateral mobs lumbar spine     Soft tissue mobilization  working through lumbar spine musculature; bilat lumbar paraspinals and QL/lats into the hip musculature     Myofascial Release  lumbar/hip musculature        Trigger Point Dry Needling - 07/24/17 1151    Consent Given?  Yes  Muscles Treated Lower Body  -- bilat - not a fan of estim with DN - no estim     Longissimus Response  Palpable increased muscle length    Gluteus Maximus Response  Palpable increased muscle length    Gluteus Minimus Response  Palpable increased muscle length    Piriformis Response  Palpable increased muscle length           PT Education - 07/24/17 1120    Education provided  Yes    Education Details  HEP     Person(s) Educated  Patient    Methods  Explanation;Demonstration;Tactile cues;Verbal cues;Handout    Comprehension  Verbalized understanding;Returned demonstration;Verbal cues required;Tactile cues required          PT Long Term Goals - 07/24/17 1110      PT LONG TERM GOAL #1   Time  6    Period  Weeks    Status  On-going      PT LONG TERM GOAL #2   Title  Instruct pt in good body mechanics and back care with pt to demo good techniques in clinic and report imporoved mechanics at home 07/31/17    Time  6    Period  Weeks    Status  On-going      PT LONG TERM GOAL #3   Title  Patient reports ability to sit/stand/walk for  functional activities with minimal to no pain 07/31/17    Time  6    Period  Weeks    Status  On-going      PT LONG TERM GOAL #4   Title  I in HEP 07/31/17    Time  6    Period  Weeks    Status  On-going      PT LONG TERM GOAL #5   Title  Improve FOTO to </= 23% limitation 07/31/17    Time  6    Period  Weeks    Status  On-going            Plan - 07/24/17 1153    Clinical Impression Statement  Good improvement with LBP and radicular symptoms. Patient continues to have palpable tightness through the lumbar paraspinasl, bilat hips. She responds well to DN with decresed palpable tightness and decresaed symptoms following treatment. Tolerated core stabilization exercises well today. Progressing well toward stated goals of therapy.     Rehab Potential  Good    PT Frequency  2x / week    PT Duration  6 weeks    PT Treatment/Interventions  Patient/family education;ADLs/Self Care Home Management;Cryotherapy;Electrical Stimulation;Iontophoresis 33m/ml Dexamethasone;Moist Heat;Traction;Ultrasound;Dry needling;Manual techniques;Neuromuscular re-education;Therapeutic activities;Therapeutic exercise    PT Next Visit Plan  progress with core stabilization; manual work Lt psoas/posterior hip; modalities as indicated; continue DN; work on psoas tightness     Consulted and Agree with Plan of Care  Patient       Patient will benefit from skilled therapeutic intervention in order to improve the following deficits and impairments:  Postural dysfunction, Improper body mechanics, Increased fascial restricitons, Increased muscle spasms, Decreased mobility, Decreased range of motion, Decreased activity tolerance  Visit Diagnosis: Acute bilateral low back pain, with sciatica presence unspecified  Lumbar radiculopathy  Other symptoms and signs involving the musculoskeletal system  Abnormal posture     Problem List Patient Active Problem List   Diagnosis Date Noted  . Recurrent pansinusitis  01/02/2017  . Right foot pain 06/12/2016  . Vestibular dizziness involving right inner ear 05/16/2016  . Hypovitaminosis  D 04/21/2015  . Chronic fatigue 04/21/2015  . Globus pharyngeus 04/12/2015  . Dysphagia, pharyngoesophageal phase 03/16/2015  . Depression 03/16/2015  . Seasonal allergies 03/16/2015  . Low back pain 04/05/2014    Franchon Ketterman Nilda Simmer PT, MPH  07/24/2017, 11:57 AM  [facility] [place], [place], [address] Phone: [phone]   Fax:  [phone]  Name: Kirsten Herrera MRN: [id number] Date of Birth: January 31, 1976   PHYSICAL THERAPY DISCHARGE SUMMARY  Visits from Start of Care: 4  Current functional level related to goals / functional outcomes: See last progress note for discharge status   Remaining deficits: Continued intermittent symptoms   Education / Equipment: HEP  Plan: Patient agrees to discharge.  Patient goals were partially met. Patient is being discharged due to not returning since the last visit.  ?????     Deshanda Molitor P. Helene Kelp PT, MPH 09/02/17 8:46 AM

## 2017-07-24 NOTE — Patient Instructions (Signed)
Resisted External Rotation: in Neutral - Bilateral Low Row: Standing   Face anchor, feet shoulder width apart. Palms up, pull arms back, squeezing shoulder blades together. Repeat 10__ times per set. Do 2-3__ sets per session. Do 2-3__ sessions per week. Anchor Height: Waist  Step back with bow and arrow    Strengthening: Resisted Extension   Hold tubing in right hand, arm forward. Pull arm back, elbow straight. Repeat _10___ times per set. Do 2-3____ sets per session. Do 2-3____ sessions per day.  Back Wall Slide    With feet __10-12__ inches from wall, lean as much of back against the wall as possible. Gently squat down __8-10 _ inches, keeping back against wall. Hold __5__ seconds while counting out loud. Repeat __10__ times. Do __1-2__ sessions per day

## 2017-09-04 ENCOUNTER — Encounter: Payer: Self-pay | Admitting: Emergency Medicine

## 2017-09-04 ENCOUNTER — Emergency Department (INDEPENDENT_AMBULATORY_CARE_PROVIDER_SITE_OTHER)
Admission: EM | Admit: 2017-09-04 | Discharge: 2017-09-04 | Disposition: A | Discharge status: Home / Self Care | Source: Home / Self Care | Attending: Family Medicine | Admitting: Family Medicine

## 2017-09-04 DIAGNOSIS — J0100 Acute maxillary sinusitis, unspecified: Secondary | ICD-10-CM

## 2017-09-04 DIAGNOSIS — R110 Nausea: Secondary | ICD-10-CM

## 2017-09-04 MED ORDER — PREDNISONE 20 MG PO TABS: ORAL_TABLET | ORAL | 0 refills | Status: DC

## 2017-09-04 MED ORDER — ONDANSETRON HCL 4 MG PO TABS: 4.0000 mg | ORAL_TABLET | Freq: Four times a day (QID) | ORAL | 0 refills | Status: DC

## 2017-09-04 MED ORDER — AMOXICILLIN-POT CLAVULANATE 875-125 MG PO TABS: 1.0000 | ORAL_TABLET | Freq: Two times a day (BID) | ORAL | 0 refills | Status: DC

## 2017-09-04 NOTE — ED Triage Notes (Signed)
Pt c/o left ear pain and fatigue that started x2 days ago. States her son was dx with positive flu A.

## 2017-09-04 NOTE — ED Provider Notes (Signed)
[name]    CSN: [id number] Arrival date & time: 09/04/17  1034     History   Chief Complaint Chief Complaint  Patient presents with  . Otalgia    HPI Kirsten Herrera is a 42 y.o. female.   HPI  Kirsten Herrera is a 42 y.o. female presenting to UC with c/o 2 days worsening sinus congestion Left side facial pain, HA and Left ear pain.  Hx of sinus infections, symptoms feel similar. He son tx positive for Flu A a few days ago. Pt did get her flu vaccine and has been wearing a mask at home to help prevent getting sick.  Pt had temp of 101*F yesterday that resolved with ibuprofen, no fever today.  Pt denies body aches. She does not feel like she has the flu and is not concerned about having the flu.  She has been using Flonase and tylenol with mild relief.   Past Medical History:  Diagnosis Date  . Anxiety   . Asthma   . Seasonal allergies     Patient Active Problem List   Diagnosis Date Noted  . Recurrent pansinusitis 01/02/2017  . Right foot pain 06/12/2016  . Vestibular dizziness involving right inner ear 05/16/2016  . Hypovitaminosis D 04/21/2015  . Chronic fatigue 04/21/2015  . Globus pharyngeus 04/12/2015  . Dysphagia, pharyngoesophageal phase 03/16/2015  . Depression 03/16/2015  . Seasonal allergies 03/16/2015  . Low back pain 04/05/2014    Past Surgical History:  Procedure Laterality Date  . ABDOMINAL SURGERY     c-section x 2   . CESAREAN SECTION    . TUBAL LIGATION    . tuval ligation      OB History    No data available       Home Medications    Prior to Admission medications   Medication Sig Start Date End Date Taking? Authorizing Provider  amitriptyline (ELAVIL) 10 MG tablet  05/15/17   [provider]  amoxicillin-clavulanate (AUGMENTIN) 875-125 MG tablet Take 1 tablet by mouth 2 (two) times daily. One po bid x 7 days 09/04/17   Noe Gens, PA-C  diazepam (VALIUM) 5 MG tablet Take 1 tablet (5 mg total) by mouth  every 6 (six) hours as needed for anxiety. 07/08/17   Dene Gentry, MD  lamoTRIgine (LAMICTAL) 100 MG tablet  05/15/17   [provider]  ondansetron (ZOFRAN) 4 MG tablet Take 1 tablet (4 mg total) by mouth every 6 (six) hours. 09/04/17   Noe Gens, PA-C  oxyCODONE-acetaminophen (PERCOCET/ROXICET) 5-325 MG tablet Take 1 tablet by mouth every 4 (four) hours as needed for severe pain. 07/08/17   Dene Gentry, MD  predniSONE (DELTASONE) 20 MG tablet 3 tabs po day one, then 2 po daily x 4 days 09/04/17   Noe Gens, PA-C    Family History Family History  Problem Relation Age of Onset  . Parkinson's disease Father        Grandmother said Dad had [employer] White Disease   . Cancer Other        throat  . Diabetes Other   . Stroke Other     Social History Social History   Tobacco Use  . Smoking status: Former Smoker    Last attempt to quit: 08/17/2005    Years since quitting: 12.0  . Smokeless tobacco: Never Used  Substance Use Topics  . Alcohol use: Yes    Alcohol/week: 0.0 oz  . Drug  use: No     Allergies   Patient has no known allergies.   Review of Systems Review of Systems  Constitutional: Negative for chills and fever.  HENT: Positive for congestion, ear pain (Left), postnasal drip, sinus pressure and sinus pain. Negative for rhinorrhea, sore throat, trouble swallowing and voice change.   Respiratory: Positive for cough. Negative for shortness of breath.   Cardiovascular: Negative for chest pain and palpitations.  Gastrointestinal: Negative for abdominal pain, diarrhea, nausea and vomiting.  Musculoskeletal: Negative for arthralgias, back pain and myalgias.  Skin: Negative for rash.  Neurological: Positive for headaches (Left side). Negative for dizziness and light-headedness.     Physical Exam Triage Vital Signs ED Triage Vitals  Enc Vitals Group     BP 09/04/17 1146 (!) 137/98     Pulse Rate 09/04/17 1146 80     Resp --      Temp  09/04/17 1146 97.8 F (36.6 C)     Temp Source 09/04/17 1146 Oral     SpO2 09/04/17 1146 100 %     Weight 09/04/17 1147 176 lb (79.8 kg)     Height --      Head Circumference --      Peak Flow --      Pain Score 09/04/17 1147 0     Pain Loc --      Pain Edu? --      Excl. in [place]? --    No data found.  Updated Vital Signs BP (!) 137/98 (BP Location: Right Arm)   Pulse 80   Temp 97.8 F (36.6 C) (Oral)   Wt 176 lb (79.8 kg)   SpO2 100%   BMI 27.57 kg/m   Visual Acuity Right Eye Distance:   Left Eye Distance:   Bilateral Distance:    Right Eye Near:   Left Eye Near:    Bilateral Near:     Physical Exam  Constitutional: She is oriented to person, place, and time. She appears well-developed and well-nourished. No distress.  HENT:  Head: Normocephalic and atraumatic.  Right Ear: Tympanic membrane normal.  Left Ear: Tympanic membrane is not erythematous and not bulging. A middle ear effusion is present.  Nose: Mucosal edema present. Right sinus exhibits no maxillary sinus tenderness and no frontal sinus tenderness. Left sinus exhibits maxillary sinus tenderness ( significant tenderness). Left sinus exhibits no frontal sinus tenderness.  Mouth/Throat: Uvula is midline, oropharynx is clear and moist and mucous membranes are normal.  Eyes: EOM are normal.  Neck: Normal range of motion. Neck supple.  Cardiovascular: Normal rate and regular rhythm.  Pulmonary/Chest: Effort normal and breath sounds normal. No stridor. No respiratory distress. She has no wheezes. She has no rales.  Musculoskeletal: Normal range of motion.  Lymphadenopathy:    She has no cervical adenopathy.  Neurological: She is alert and oriented to person, place, and time.  Skin: Skin is warm and dry. She is not diaphoretic.  Psychiatric: She has a normal mood and affect. Her behavior is normal.  Nursing note and vitals reviewed.    UC Treatments / Results  Labs (all labs ordered are listed, but only  abnormal results are displayed) Labs Reviewed - No data to display  EKG  EKG Interpretation None       Radiology No results found.  Procedures Procedures (including critical care time)  Medications Ordered in UC Medications - No data to display   Initial Impression / Assessment and Plan / UC Course  I  have reviewed the triage vital signs and the nursing notes.  Pertinent labs & imaging results that were available during my care of the patient were reviewed by me and considered in my medical decision making (see chart for details).     Hx and exam c/w sinusitis Discussed treatment or prophylaxis for flu, pt declined.  Will tx sinusitis with Augmentin and Prednisone F/u with PCP as needed.   Final Clinical Impressions(s) / UC Diagnoses   Final diagnoses:  Acute non-recurrent maxillary sinusitis  Nausea without vomiting    ED Discharge Orders        Ordered    ondansetron (ZOFRAN) 4 MG tablet  Every 6 hours     09/04/17 1213    amoxicillin-clavulanate (AUGMENTIN) 875-125 MG tablet  2 times daily     09/04/17 1213    predniSONE (DELTASONE) 20 MG tablet     09/04/17 1213       Controlled Substance Prescriptions [place] Controlled Substance Registry consulted? Not Applicable   Noe Gens, PA-C 09/04/17 1255

## 2017-12-20 ENCOUNTER — Other Ambulatory Visit: Payer: Self-pay

## 2017-12-20 ENCOUNTER — Encounter: Payer: Self-pay | Admitting: Emergency Medicine

## 2017-12-20 ENCOUNTER — Emergency Department (INDEPENDENT_AMBULATORY_CARE_PROVIDER_SITE_OTHER)
Admission: EM | Admit: 2017-12-20 | Discharge: 2017-12-20 | Disposition: A | Discharge status: Home / Self Care | Source: Home / Self Care | Attending: Family Medicine | Admitting: Family Medicine

## 2017-12-20 DIAGNOSIS — N3001 Acute cystitis with hematuria: Secondary | ICD-10-CM | POA: Diagnosis not present

## 2017-12-20 LAB — POCT URINALYSIS DIP (MANUAL ENTRY)
BILIRUBIN UA: NEGATIVE
Glucose, UA: NEGATIVE mg/dL
Ketones, POC UA: NEGATIVE mg/dL
NITRITE UA: NEGATIVE
Protein Ur, POC: NEGATIVE mg/dL
Spec Grav, UA: <=1.005 — AB (ref 1.010–1.025)
UROBILINOGEN UA: 0.2 U/dL
pH, UA: 6 (ref 5.0–8.0)

## 2017-12-20 MED ORDER — PHENAZOPYRIDINE HCL 200 MG PO TABS: 200.0000 mg | ORAL_TABLET | Freq: Three times a day (TID) | ORAL | 0 refills | Status: DC

## 2017-12-20 MED ORDER — CEPHALEXIN 500 MG PO CAPS: 500.0000 mg | ORAL_CAPSULE | Freq: Two times a day (BID) | ORAL | 0 refills | Status: DC

## 2017-12-20 NOTE — ED Triage Notes (Signed)
Reports dysuria increasing intensity over past 2 days; has tried home remedies without relief.

## 2017-12-20 NOTE — ED Provider Notes (Signed)
[name]    CSN: [id number] Arrival date & time: 12/20/17  0901     History   Chief Complaint Chief Complaint  Patient presents with  . Dysuria    HPI Kirsten Herrera is a 42 y.o. female.   HPI  Kirsten Herrera is a 42 y.o. female presenting to UC with c/o pain with urination and frequency over the last 2 days, gradually worsening.  She has tried OTC cranberry tabs and increasing her water intake but no relief. Hx of UTIs in the past. Symptoms feel similar. Last UTI was several years ago. Pt does report having intercourse for the first time "in a while" with her husband and wonders if that could have triggered her symptoms. No concern for STIs. Denies vaginal symptoms. Denies abdominal pain or back pain. No fever, chills, n/v/d.    Past Medical History:  Diagnosis Date  . Anxiety   . Asthma   . Seasonal allergies     Patient Active Problem List   Diagnosis Date Noted  . Recurrent pansinusitis 01/02/2017  . Right foot pain 06/12/2016  . Vestibular dizziness involving right inner ear 05/16/2016  . Hypovitaminosis D 04/21/2015  . Chronic fatigue 04/21/2015  . Globus pharyngeus 04/12/2015  . Dysphagia, pharyngoesophageal phase 03/16/2015  . Depression 03/16/2015  . Seasonal allergies 03/16/2015  . Low back pain 04/05/2014    Past Surgical History:  Procedure Laterality Date  . ABDOMINAL SURGERY     c-section x 2   . CESAREAN SECTION    . TUBAL LIGATION    . tuval ligation      OB History   None      Home Medications    Prior to Admission medications   Medication Sig Start Date End Date Taking? Authorizing Provider  amitriptyline (ELAVIL) 10 MG tablet  05/15/17   [provider]  amoxicillin-clavulanate (AUGMENTIN) 875-125 MG tablet Take 1 tablet by mouth 2 (two) times daily. One po bid x 7 days 09/04/17   Noe Gens, PA-C  cephALEXin (KEFLEX) 500 MG capsule Take 1 capsule (500 mg total) by mouth 2 (two) times daily. 12/20/17    Noe Gens, PA-C  diazepam (VALIUM) 5 MG tablet Take 1 tablet (5 mg total) by mouth every 6 (six) hours as needed for anxiety. 07/08/17   Dene Gentry, MD  lamoTRIgine (LAMICTAL) 100 MG tablet  05/15/17   [provider]  ondansetron (ZOFRAN) 4 MG tablet Take 1 tablet (4 mg total) by mouth every 6 (six) hours. 09/04/17   Noe Gens, PA-C  oxyCODONE-acetaminophen (PERCOCET/ROXICET) 5-325 MG tablet Take 1 tablet by mouth every 4 (four) hours as needed for severe pain. 07/08/17   Hudnall, Sharyn Lull, MD  phenazopyridine (PYRIDIUM) 200 MG tablet Take 1 tablet (200 mg total) by mouth 3 (three) times daily. 12/20/17   Noe Gens, PA-C  predniSONE (DELTASONE) 20 MG tablet 3 tabs po day one, then 2 po daily x 4 days 09/04/17   Noe Gens, PA-C    Family History Family History  Problem Relation Age of Onset  . Parkinson's disease Father        Grandmother said Dad had [employer] White Disease   . Cancer Other        throat  . Diabetes Other   . Stroke Other     Social History Social History   Tobacco Use  . Smoking status: Former Smoker    Last attempt to quit: 08/17/2005  Years since quitting: 12.3  . Smokeless tobacco: Never Used  Substance Use Topics  . Alcohol use: Yes    Alcohol/week: 0.0 oz  . Drug use: No     Allergies   Patient has no known allergies.   Review of Systems Review of Systems  Constitutional: Negative for chills and fever.  Gastrointestinal: Negative for abdominal pain, nausea and vomiting.  Genitourinary: Positive for dysuria, frequency and urgency. Negative for decreased urine volume, flank pain, hematuria, pelvic pain, vaginal bleeding, vaginal discharge and vaginal pain.  Musculoskeletal: Negative for back pain and myalgias.     Physical Exam Triage Vital Signs ED Triage Vitals [12/20/17 0921]  Enc Vitals Group     BP 121/85     Pulse Rate 71     Resp 18     Temp 97.6 F (36.4 C)     Temp Source Oral     SpO2 100 %      Weight 125 lb (56.7 kg)     Height 5\' 7"  (1.702 m)     Head Circumference      Peak Flow      Pain Score 2     Pain Loc      Pain Edu?      Excl. in [place]?    No data found.  Updated Vital Signs BP 121/85 (BP Location: Right Arm)   Pulse 71   Temp 97.6 F (36.4 C) (Oral)   Resp 18   Ht 5\' 7"  (1.702 m)   Wt 125 lb (56.7 kg)   LMP 12/14/2017 (Exact Date)   SpO2 100%   BMI 19.58 kg/m   Visual Acuity Right Eye Distance:   Left Eye Distance:   Bilateral Distance:    Right Eye Near:   Left Eye Near:    Bilateral Near:     Physical Exam  Constitutional: She is oriented to person, place, and time. She appears well-developed and well-nourished. No distress.  HENT:  Head: Normocephalic and atraumatic.  Mouth/Throat: Oropharynx is clear and moist.  Eyes: EOM are normal.  Neck: Normal range of motion.  Cardiovascular: Normal rate and regular rhythm.  Pulmonary/Chest: Effort normal and breath sounds normal. No stridor. No respiratory distress. She has no wheezes. She has no rales.  Abdominal: Soft. She exhibits no distension. There is no tenderness. There is no CVA tenderness.  Musculoskeletal: Normal range of motion.  Neurological: She is alert and oriented to person, place, and time.  Skin: Skin is warm and dry. She is not diaphoretic.  Psychiatric: She has a normal mood and affect. Her behavior is normal.  Nursing note and vitals reviewed.    UC Treatments / Results  Labs (all labs ordered are listed, but only abnormal results are displayed) Labs Reviewed  POCT URINALYSIS DIP (MANUAL ENTRY) - Abnormal; Notable for the following components:      Result Value   Color, UA light yellow (*)    Spec Grav, UA <=1.005 (*)    Blood, UA moderate (*)    Leukocytes, UA Small (1+) (*)    All other components within normal limits  URINE CULTURE    EKG None  Radiology No results found.  Procedures Procedures (including critical care time)  Medications Ordered in  UC Medications - No data to display  Initial Impression / Assessment and Plan / UC Course  I have reviewed the triage vital signs and the nursing notes.  Pertinent labs & imaging results that were available during my care  of the patient were reviewed by me and considered in my medical decision making (see chart for details).     Hx and exam c/w UTI Culture sent Home care instructions provided below.    Final Clinical Impressions(s) / UC Diagnoses   Final diagnoses:  Acute cystitis with hematuria     Discharge Instructions      Please take antibiotics as prescribed and be sure to complete entire course even if you start to feel better to ensure infection does not come back.  Pyridium has been prescribed to help with urinary discomfort and bladder spasms.  This medication can cause your urine to be orange, this is normal. If this medication is not covered by your insurance, you may get the same medication, known as Azo, over the counter.  Please ask the pharmacist for assistance if needed.      ED Prescriptions    Medication Sig Dispense Auth. Provider   cephALEXin (KEFLEX) 500 MG capsule Take 1 capsule (500 mg total) by mouth 2 (two) times daily. 14 capsule Leeroy Cha O, PA-C   phenazopyridine (PYRIDIUM) 200 MG tablet Take 1 tablet (200 mg total) by mouth 3 (three) times daily. 6 tablet Noe Gens, PA-C     Controlled Substance Prescriptions [place] Controlled Substance Registry consulted? Not Applicable   Tyrell Antonio 12/20/17 1131

## 2017-12-20 NOTE — Discharge Instructions (Signed)
°  Please take antibiotics as prescribed and be sure to complete entire course even if you start to feel better to ensure infection does not come back.  Pyridium has been prescribed to help with urinary discomfort and bladder spasms.  This medication can cause your urine to be orange, this is normal. If this medication is not covered by your insurance, you may get the same medication, known as Azo, over the counter.  Please ask the pharmacist for assistance if needed.

## 2017-12-21 LAB — URINE CULTURE
MICRO NUMBER:: 90603593
SPECIMEN QUALITY:: ADEQUATE

## 2017-12-23 ENCOUNTER — Telehealth: Payer: Self-pay | Admitting: Emergency Medicine

## 2017-12-27 ENCOUNTER — Ambulatory Visit (INDEPENDENT_AMBULATORY_CARE_PROVIDER_SITE_OTHER): Admitting: Osteopathic Medicine

## 2017-12-27 ENCOUNTER — Encounter: Payer: Self-pay | Admitting: Osteopathic Medicine

## 2017-12-27 VITALS — BP 128/74 | HR 75 | Temp 98.6°F | Wt 167.4 lb

## 2017-12-27 DIAGNOSIS — R5382 Chronic fatigue, unspecified: Secondary | ICD-10-CM

## 2017-12-27 DIAGNOSIS — Z23 Encounter for immunization: Secondary | ICD-10-CM

## 2017-12-27 NOTE — Progress Notes (Signed)
HPI: Kirsten Herrera is a 42 y.o. female who  has a past medical history of Anxiety, Asthma, and Seasonal allergies.  she presents to [facility] today, 12/27/17,  for chief complaint of:  Fatigue  Worse over the past month or so - feeling okay in AM but evenings napping a lot, easily fatigued. No major changes in her life that she can think of which would correspond to this symptom. PHQ as below. Wt 120s recently I suspect is error, she has been consistently 160s-170s and no c/o weight gain now.   Labs 04/2017: ordered, not done Last labs on file 03/2015, CBC and CMP ok, TSH WNL     Past medical history, surgical history, and family history reviewed.  Current medication list and allergy/intolerance information reviewed.   (See remainder of HPI, ROS, Phys Exam below)  No results found.  No results found for this or any previous visit (from the past 72 hour(s)).   Depression screen PHQ 2/9 12/27/2017  Decreased Interest 1  Down, Depressed, Hopeless 0  PHQ - 2 Score 1  Altered sleeping 2  Tired, decreased energy 3  Change in appetite 0  Feeling bad or failure about yourself  0  Trouble concentrating 1  Moving slowly or fidgety/restless 0  Suicidal thoughts 0  PHQ-9 Score 7  Difficult doing work/chores Not difficult at all      ASSESSMENT/PLAN:   Chronic fatigue - overdue for labs, request Lyme testing (advised on posisble results of this), doing well w/ diet/exercise and moods are fine. Consider HIV/inflamm w/u prn  - Plan: CBC, COMPLETE METABOLIC PANEL WITH GFR, Lipid panel, TSH, VITAMIN D 25 Hydroxy (Vit-D Deficiency, Fractures), Hemoglobin A1c, B. burgdorfi antibodies  Need for Tdap vaccination - Plan: Tdap vaccine greater than or equal to 7yo IM     Follow-up plan: Return for rechck depending on labs  .     ############################################ ############################################ ############################################ ############################################    Outpatient Encounter Medications as of 12/27/2017  Medication Sig  . amitriptyline (ELAVIL) 10 MG tablet   . diazepam (VALIUM) 5 MG tablet Take 1 tablet (5 mg total) by mouth every 6 (six) hours as needed for anxiety.  . lamoTRIgine (LAMICTAL) 100 MG tablet   . [DISCONTINUED] amoxicillin-clavulanate (AUGMENTIN) 875-125 MG tablet Take 1 tablet by mouth 2 (two) times daily. One po bid x 7 days  . [DISCONTINUED] cephALEXin (KEFLEX) 500 MG capsule Take 1 capsule (500 mg total) by mouth 2 (two) times daily.  . [DISCONTINUED] ondansetron (ZOFRAN) 4 MG tablet Take 1 tablet (4 mg total) by mouth every 6 (six) hours.  . [DISCONTINUED] oxyCODONE-acetaminophen (PERCOCET/ROXICET) 5-325 MG tablet Take 1 tablet by mouth every 4 (four) hours as needed for severe pain.  . [DISCONTINUED] phenazopyridine (PYRIDIUM) 200 MG tablet Take 1 tablet (200 mg total) by mouth 3 (three) times daily.  . [DISCONTINUED] predniSONE (DELTASONE) 20 MG tablet 3 tabs po day one, then 2 po daily x 4 days   No facility-administered encounter medications on file as of 12/27/2017.    No Known Allergies    Review of Systems:  Constitutional: +recent illness - fever and UTI   HEENT: No  headache, no vision change  Cardiac: No  chest pain, No  pressure, No palpitations  Respiratory:  No  shortness of breath. No  Cough  Gastrointestinal: No  abdominal pain, no change on bowel habits  Musculoskeletal: No new myalgia/arthralgia  Skin: No  Rash or hair loss  Hem/Onc: No  easy bruising/bleeding, No  abnormal lumps/bumps  Neurologic: No  weakness, No  Dizziness  Psychiatric: No  concerns with depression, No  concerns with anxiety  Exam:  BP 128/74 (BP Location: Left Arm, Patient Position: Sitting, Cuff Size: Normal)   Pulse 75    Temp 98.6 F (37 C) (Oral)   Wt 167 lb 6.4 oz (75.9 kg)   LMP 12/14/2017 (Exact Date)   BMI 26.22 kg/m   Constitutional: VS see above. General Appearance: alert, well-developed, well-nourished, NAD  Eyes: Normal lids and conjunctive, non-icteric sclera  Ears, Nose, Mouth, Throat: MMM, Normal external inspection ears/nares/mouth/lips/gums.  Neck: No masses, trachea midline. Thyroid normal   Respiratory: Normal respiratory effort. no wheeze, no rhonchi, no rales  Cardiovascular: S1/S2 normal, no murmur, no rub/gallop auscultated. RRR.   Abdominal: no HSM, nontender   Musculoskeletal: Gait normal. Symmetric and independent movement of all extremities  Neurological: Normal balance/coordination. No tremor.  Skin: warm, dry, intact.   Psychiatric: Normal judgment/insight. Normal mood and affect. Oriented x3.   Visit summary with medication list and pertinent instructions was printed for patient to review, advised to alert [place] if any changes needed. All questions at time of visit were answered - patient instructed to contact office with any additional concerns. ER/RTC precautions were reviewed with the patient and understanding verbalized.   Follow-up plan: Return for rechck depending on labs .    Please note: voice recognition software was used to produce this document, and typos may escape review. Please contact Dr. Sheppard Coil for any needed clarifications.

## 2017-12-31 LAB — CBC
HCT: 39.4 % (ref 35.0–45.0)
Hemoglobin: 13.1 g/dL (ref 11.7–15.5)
MCH: 30.2 pg (ref 27.0–33.0)
MCHC: 33.2 g/dL (ref 32.0–36.0)
MCV: 90.8 fL (ref 80.0–100.0)
MPV: 9.6 fL (ref 7.5–12.5)
PLATELETS: 342 10*3/uL (ref 140–400)
RBC: 4.34 10*6/uL (ref 3.80–5.10)
RDW: 12.2 % (ref 11.0–15.0)
WBC: 5.9 10*3/uL (ref 3.8–10.8)

## 2017-12-31 LAB — COMPLETE METABOLIC PANEL WITH GFR
AG RATIO: 1.8 (calc) (ref 1.0–2.5)
ALBUMIN MSPROF: 4.4 g/dL (ref 3.6–5.1)
ALKALINE PHOSPHATASE (APISO): 41 U/L (ref 33–115)
ALT: 14 U/L (ref 6–29)
AST: 16 U/L (ref 10–30)
BILIRUBIN TOTAL: 0.5 mg/dL (ref 0.2–1.2)
BUN: 18 mg/dL (ref 7–25)
CO2: 27 mmol/L (ref 20–32)
Calcium: 9.6 mg/dL (ref 8.6–10.2)
Chloride: 102 mmol/L (ref 98–110)
Creat: 0.89 mg/dL (ref 0.50–1.10)
GFR, EST AFRICAN AMERICAN: 93 mL/min/{1.73_m2} (ref >=60)
GFR, Est Non African American: 81 mL/min/{1.73_m2} (ref >=60)
Globulin: 2.5 g/dL (calc) (ref 1.9–3.7)
Glucose, Bld: 71 mg/dL (ref 65–99)
Potassium: 4.5 mmol/L (ref 3.5–5.3)
Sodium: 137 mmol/L (ref 135–146)
Total Protein: 6.9 g/dL (ref 6.1–8.1)

## 2017-12-31 LAB — HEMOGLOBIN A1C
HEMOGLOBIN A1C: 5.3 %{Hb} (ref <5.7)
Mean Plasma Glucose: 105 (calc)
eAG (mmol/L): 5.8 (calc)

## 2017-12-31 LAB — LIPID PANEL
CHOL/HDL RATIO: 3.6 (calc) (ref <5.0)
Cholesterol: 198 mg/dL (ref <200)
HDL: 55 mg/dL (ref >50)
LDL CHOLESTEROL (CALC): 127 mg/dL — AB
NON-HDL CHOLESTEROL (CALC): 143 mg/dL — AB (ref <130)
Triglycerides: 64 mg/dL (ref <150)

## 2017-12-31 LAB — VITAMIN D 25 HYDROXY (VIT D DEFICIENCY, FRACTURES): Vit D, 25-Hydroxy: 33 ng/mL (ref 30–100)

## 2017-12-31 LAB — B. BURGDORFI ANTIBODIES

## 2017-12-31 LAB — TSH: TSH: 2.52 m[IU]/L

## 2018-06-03 ENCOUNTER — Emergency Department (INDEPENDENT_AMBULATORY_CARE_PROVIDER_SITE_OTHER)

## 2018-06-03 ENCOUNTER — Other Ambulatory Visit: Payer: Self-pay

## 2018-06-03 ENCOUNTER — Emergency Department (INDEPENDENT_AMBULATORY_CARE_PROVIDER_SITE_OTHER)
Admission: EM | Admit: 2018-06-03 | Discharge: 2018-06-03 | Disposition: A | Discharge status: Home / Self Care | Source: Home / Self Care | Attending: Family Medicine | Admitting: Family Medicine

## 2018-06-03 DIAGNOSIS — S5012XA Contusion of left forearm, initial encounter: Secondary | ICD-10-CM | POA: Diagnosis not present

## 2018-06-03 DIAGNOSIS — M25522 Pain in left elbow: Secondary | ICD-10-CM | POA: Diagnosis not present

## 2018-06-03 NOTE — Discharge Instructions (Signed)
°  You may take 500mg  acetaminophen every 4-6 hours or in combination with ibuprofen 400-600mg  every 6-8 hours as needed for pain and inflammation.  Please follow up with family medicine in 1 week if not improving.
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
## 2018-07-17 ENCOUNTER — Other Ambulatory Visit: Payer: Self-pay

## 2018-07-17 ENCOUNTER — Emergency Department (INDEPENDENT_AMBULATORY_CARE_PROVIDER_SITE_OTHER)
Admission: EM | Admit: 2018-07-17 | Discharge: 2018-07-17 | Disposition: A | Discharge status: Home / Self Care | Source: Home / Self Care | Attending: Family Medicine | Admitting: Family Medicine

## 2018-07-17 ENCOUNTER — Encounter: Payer: Self-pay | Admitting: *Deleted

## 2018-07-17 DIAGNOSIS — B9789 Other viral agents as the cause of diseases classified elsewhere: Secondary | ICD-10-CM

## 2018-07-17 DIAGNOSIS — J069 Acute upper respiratory infection, unspecified: Secondary | ICD-10-CM | POA: Diagnosis not present

## 2018-07-17 DIAGNOSIS — M940 Chondrocostal junction syndrome [Tietze]: Secondary | ICD-10-CM

## 2018-07-17 MED ORDER — PREDNISONE 20 MG PO TABS: ORAL_TABLET | ORAL | 0 refills | Status: DC

## 2018-07-17 MED ORDER — AZITHROMYCIN 250 MG PO TABS: ORAL_TABLET | ORAL | 0 refills | Status: DC

## 2018-07-17 NOTE — ED Provider Notes (Signed)
[facility]    CSN: [id number] Arrival date & time: 07/17/18  1242     History   Chief Complaint Chief Complaint  Patient presents with  . Cough    HPI Kirsten Herrera is a 42 y.o. female.   Patient complains of five day history of typical cold-like symptoms developing over several days, including mild sore throat, sinus congestion, headache, hoarseness, myalgias, fatigue, and cough.  Her cough became worse at night 2 days ago, and she now feels tightness in her anterior chest.  She has seasonal rhinitis and a past history of asthma.  The history is provided by the patient.    Past Medical History:  Diagnosis Date  . Anxiety   . Asthma   . Seasonal allergies     Patient Active Problem List   Diagnosis Date Noted  . Recurrent pansinusitis 01/02/2017  . Right foot pain 06/12/2016  . Vestibular dizziness involving right inner ear 05/16/2016  . Hypovitaminosis D 04/21/2015  . Chronic fatigue 04/21/2015  . Globus pharyngeus 04/12/2015  . Dysphagia, pharyngoesophageal phase 03/16/2015  . Depression 03/16/2015  . Seasonal allergies 03/16/2015  . Low back pain 04/05/2014    Past Surgical History:  Procedure Laterality Date  . ABDOMINAL SURGERY     c-section x 2   . CESAREAN SECTION    . TUBAL LIGATION    . tuval ligation      OB History   No obstetric history on file.      Home Medications    Prior to Admission medications   Medication Sig Start Date End Date Taking? Authorizing Provider  amitriptyline (ELAVIL) 10 MG tablet  05/15/17   [provider]  azithromycin (ZITHROMAX Z-PAK) 250 MG tablet Take 2 tabs today; then begin one tab once daily for 4 more days. (Rx void after 07/25/18) 07/17/18   Kandra Nicolas, MD  diazepam (VALIUM) 5 MG tablet Take 1 tablet (5 mg total) by mouth every 6 (six) hours as needed for anxiety. 07/08/17   Dene Gentry, MD  lamoTRIgine (LAMICTAL) 100 MG tablet  05/15/17   [provider]    predniSONE (DELTASONE) 20 MG tablet Take one tab by mouth twice daily for 5 days, then one daily. Take with food. 07/17/18   Kandra Nicolas, MD    Family History Family History  Problem Relation Age of Onset  . Parkinson's disease Father        Grandmother said Dad had [employer] White Disease   . Cancer Other        throat  . Diabetes Other   . Stroke Other     Social History Social History   Tobacco Use  . Smoking status: Former Smoker    Last attempt to quit: 08/17/2005    Years since quitting: 12.9  . Smokeless tobacco: Never Used  Substance Use Topics  . Alcohol use: Yes    Alcohol/week: 0.0 standard drinks  . Drug use: No     Allergies   Patient has no known allergies.   Review of Systems Review of Systems + sore throat + cough No pleuritic pain, but feels tight in anterior chest. No wheezing + nasal congestion + post-nasal drainage No sinus pain/pressure No itchy/red eyes No earache No hemoptysis ? SOB No fever, + chills/sweats No nausea No vomiting No abdominal pain No diarrhea No urinary symptoms No skin rash + fatigue + myalgias + headache Used OTC meds without relief   Physical Exam Triage  Vital Signs ED Triage Vitals  Enc Vitals Group     BP 07/17/18 1328 (!) 138/94     Pulse Rate 07/17/18 1328 72     Resp 07/17/18 1328 18     Temp 07/17/18 1328 97.6 F (36.4 C)     Temp Source 07/17/18 1328 Oral     SpO2 07/17/18 1328 98 %     Weight 07/17/18 1331 171 lb (77.6 kg)     Height 07/17/18 1331 5\' 7"  (1.702 m)     Head Circumference --      Peak Flow --      Pain Score 07/17/18 1329 0     Pain Loc --      Pain Edu? --      Excl. in [place]? --    No data found.  Updated Vital Signs BP (!) 138/94 (BP Location: Right Arm)   Pulse 72   Temp 97.6 F (36.4 C) (Oral)   Resp 18   Ht 5\' 7"  (1.702 m)   Wt 77.6 kg   LMP 07/13/2018   SpO2 98%   BMI 26.78 kg/m   Visual Acuity Right Eye Distance:   Left Eye Distance:    Bilateral Distance:    Right Eye Near:   Left Eye Near:    Bilateral Near:     Physical Exam Nursing notes and Vital Signs reviewed. Appearance:  Patient appears stated age, and in no acute distress Eyes:  Pupils are equal, round, and reactive to light and accomodation.  Extraocular movement is intact.  Conjunctivae are not inflamed  Ears:  Canals normal.  Tympanic membranes normal.  Nose:  Mildly congested turbinates.  No sinus tenderness.   Pharynx:  Normal Neck:  Supple.  Enlarged posterior/lateral nodes are palpated bilaterally, tender to palpation on the left.   Lungs:  Clear to auscultation.  Breath sounds are equal.  Moving air well. Chest:  Distinct tenderness to palpation over the mid-sternum.  Heart:  Regular rate and rhythm without murmurs, rubs, or gallops.  Abdomen:  Nontender without masses or hepatosplenomegaly.  Bowel sounds are present.  No CVA or flank tenderness.  Extremities:  No edema.  Skin:  No rash present.    UC Treatments / Results  Labs (all labs ordered are listed, but only abnormal results are displayed) Labs Reviewed - No data to display  EKG None  Radiology No results found.  Procedures Procedures (including critical care time)  Medications Ordered in UC Medications - No data to display  Initial Impression / Assessment and Plan / UC Course  I have reviewed the triage vital signs and the nursing notes.  Pertinent labs & imaging results that were available during my care of the patient were reviewed by me and considered in my medical decision making (see chart for details).    Because of past history of asthma and URI's that tend to linger, will begin Prednisone burst/taper. There is no evidence of bacterial infection today.   Followup with Family Doctor if not improved in about 10 days. Final Clinical Impressions(s) / UC Diagnoses   Final diagnoses:  Viral URI with cough  Costochondritis     Discharge Instructions     Take plain  guaifenesin (1200mg  extended release tabs such as Mucinex) twice daily, with plenty of water, for cough and congestion.  May add Pseudoephedrine (30mg , one or two every 4 to 6 hours) for sinus congestion.  Get adequate rest.   May use Afrin nasal spray (or generic  oxymetazoline) each morning for about 5 days and then discontinue.  Also recommend using saline nasal spray several times daily and saline nasal irrigation (AYR is a common brand).  Use Flonase nasal spray each morning after using Afrin nasal spray and saline nasal irrigation. Try warm salt water gargles for sore throat.  Stop all antihistamines for now, and other non-prescription cough/cold preparations. May take Delsym Cough Suppressant with Tessalon at bedtime for nighttime cough.  Begin Azithromycin if not improving about one week or if persistent fever develops      ED Prescriptions    Medication Sig Dispense Auth. Provider   predniSONE (DELTASONE) 20 MG tablet Take one tab by mouth twice daily for 5 days, then one daily. Take with food. 14 tablet Kandra Nicolas, MD   azithromycin (ZITHROMAX Z-PAK) 250 MG tablet Take 2 tabs today; then begin one tab once daily for 4 more days. (Rx void after 07/25/18) 6 tablet Kandra Nicolas, MD         Kandra Nicolas, MD 07/17/18 [phone]

## 2018-07-17 NOTE — ED Triage Notes (Signed)
Pt c/o productive cough and sinus pain x 4 days. She has taken Flonase and Sinusalia with some relief.

## 2018-07-17 NOTE — Discharge Instructions (Addendum)
Take plain guaifenesin (1200mg  extended release tabs such as Mucinex) twice daily, with plenty of water, for cough and congestion.  May add Pseudoephedrine (30mg , one or two every 4 to 6 hours) for sinus congestion.  Get adequate rest.   May use Afrin nasal spray (or generic oxymetazoline) each morning for about 5 days and then discontinue.  Also recommend using saline nasal spray several times daily and saline nasal irrigation (AYR is a common brand).  Use Flonase nasal spray each morning after using Afrin nasal spray and saline nasal irrigation. Try warm salt water gargles for sore throat.  Stop all antihistamines for now, and other non-prescription cough/cold preparations. May take Delsym Cough Suppressant with Tessalon at bedtime for nighttime cough.  Begin Azithromycin if not improving about one week or if persistent fever develops

## 2018-09-03 DIAGNOSIS — R928 Other abnormal and inconclusive findings on diagnostic imaging of breast: Secondary | ICD-10-CM | POA: Insufficient documentation

## 2019-01-06 ENCOUNTER — Other Ambulatory Visit: Payer: Self-pay

## 2019-01-06 ENCOUNTER — Encounter: Payer: Self-pay | Admitting: Family Medicine

## 2019-01-06 ENCOUNTER — Ambulatory Visit (INDEPENDENT_AMBULATORY_CARE_PROVIDER_SITE_OTHER): Admitting: Family Medicine

## 2019-01-06 DIAGNOSIS — M5441 Lumbago with sciatica, right side: Secondary | ICD-10-CM

## 2019-01-06 DIAGNOSIS — M5442 Lumbago with sciatica, left side: Secondary | ICD-10-CM | POA: Diagnosis not present

## 2019-01-06 MED ORDER — PREDNISONE 5 MG PO TABS: ORAL_TABLET | ORAL | 0 refills | Status: DC

## 2019-01-06 MED ORDER — BACLOFEN 10 MG PO TABS: 10.0000 mg | ORAL_TABLET | Freq: Two times a day (BID) | ORAL | 0 refills | Status: DC | PRN

## 2019-01-06 NOTE — Assessment & Plan Note (Signed)
Acute on chronic worsening low back pain.  Could be associated with SI joint but reports some radicular symptoms.  Previous MRI from 2015 does not demonstrate the specific dermatome she is experiencing. -Prednisone. -Baclofen. -Counseled on home exercise therapy and supportive care. -If no improvement need to consider SI joint injection, or MRI for epidural injections.

## 2019-01-06 NOTE — Patient Instructions (Addendum)
Nice to meet you Please try heat on the lower back  Please try the exercises  Please try avoid sitting or lying for prolonged periods of time   Please take famotidine with the ibuprofen if you are taking the ibuprofen regularly. The baclofen is a muscle relaxer and can make you sleepy.   Please send me a message in MyChart with any questions or updates.  Please see me back in 4 weeks.   --Dr. Raeford Razor

## 2019-01-06 NOTE — Progress Notes (Signed)
Kirsten Herrera - 43 y.o. female MRN [id number]  Date of birth: 15-Jun-1976  SUBJECTIVE:  Including CC & ROS.  Chief Complaint  Patient presents with  . Back Pain    low back x 5 days    Kirsten Herrera is a 43 y.o. female that is presenting with acute on chronic low back pain.  This is been worsening as of late.  She is pain is been present for several years.  She has had conservative measures and previous MRI.  She denies any specific inciting event.  She is trying a new workout.  She feels the pain in the lower lumbar spine with some radiation down both aspects of her legs to the front quad.  It does not past the knee.  She denies any weakness or numbness.  No saddle anesthesia..  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   Review of Systems  Constitutional: Negative for fever.  HENT: Negative for congestion.   Respiratory: Negative for cough.   Cardiovascular: Negative for chest pain.  Gastrointestinal: Negative for abdominal pain.  Musculoskeletal: Positive for back pain.  Skin: Negative for color change.  Neurological: Negative for weakness.  Hematological: Negative for adenopathy.    HISTORY: Past Medical, Surgical, Social, and Family History Reviewed & Updated per EMR.   Pertinent Historical Findings include:  Past Medical History:  Diagnosis Date  . Anxiety   . Asthma   . Seasonal allergies     Past Surgical History:  Procedure Laterality Date  . ABDOMINAL SURGERY     c-section x 2   . CESAREAN SECTION    . TUBAL LIGATION    . tuval ligation      No Known Allergies  Family History  Problem Relation Age of Onset  . Parkinson's disease Father        Grandmother said Dad had [employer] White Disease   . Cancer Other        throat  . Diabetes Other   . Stroke Other      Social History   Socioeconomic History  . Marital status: Married    Spouse name: Not on file  .  Number of children: Not on file  . Years of education: Not on file  . Highest education level: Not on file  Occupational History  . Not on file  Social Needs  . Financial resource strain: Not on file  . Food insecurity:    Worry: Not on file    Inability: Not on file  . Transportation needs:    Medical: Not on file    Non-medical: Not on file  Tobacco Use  . Smoking status: Former Smoker    Last attempt to quit: 08/17/2005    Years since quitting: 13.3  . Smokeless tobacco: Never Used  Substance and Sexual Activity  . Alcohol use: Yes    Alcohol/week: 0.0 standard drinks  . Drug use: No  . Sexual activity: Yes    Birth control/protection: None  Lifestyle  . Physical activity:    Days per week: Not on file    Minutes per session: Not on file  . Stress: Not on file  Relationships  . Social connections:    Talks on phone: Not on file    Gets together: Not on file    Attends religious service: Not on file    Active member of club or organization: Not on  file    Attends meetings of clubs or organizations: Not on file    Relationship status: Not on file  . Intimate partner violence:    Fear of current or ex partner: Not on file    Emotionally abused: Not on file    Physically abused: Not on file    Forced sexual activity: Not on file  Other Topics Concern  . Not on file  Social History Narrative  . Not on file     PHYSICAL EXAM:  VS: Ht 5\' 7"  (1.702 m)   Wt 178 lb (80.7 kg)   BMI 27.88 kg/m  Physical Exam Gen: NAD, alert, cooperative with exam,  ENT: normal lips, normal nasal mucosa,  Eye: normal EOM, normal conjunctiva and lids CV:  no edema, +2 pedal pulses   Resp: no accessory muscle use, non-labored,   Skin: no rashes, no areas of induration  Neuro: normal tone, normal sensation to touch Psych:  normal insight, alert and oriented MSK:  Back: Some tenderness to palpation over the left and right SI joint. No midline tenderness over the lumbar spine.  Normal internal and external rotation. Normal strength resistance with hip flexion, knee flexion extension, plantar flexion and dorsiflexion. +2 deep tendon reflexes at the patella and symmetric. Negative straight leg raise bilaterally. FABER positive on right. Negative on left  Neurovascularly intact     ASSESSMENT & PLAN:   Low back pain Acute on chronic worsening low back pain.  Could be associated with SI joint but reports some radicular symptoms.  Previous MRI from 2015 does not demonstrate the specific dermatome she is experiencing. -Prednisone. -Baclofen. -Counseled on home exercise therapy and supportive care. -If no improvement need to consider SI joint injection, or MRI for epidural injections.

## 2019-01-16 ENCOUNTER — Encounter: Payer: Self-pay | Admitting: Physical Therapy

## 2019-01-16 ENCOUNTER — Other Ambulatory Visit: Payer: Self-pay

## 2019-01-16 ENCOUNTER — Ambulatory Visit (INDEPENDENT_AMBULATORY_CARE_PROVIDER_SITE_OTHER): Admitting: Physical Therapy

## 2019-01-16 DIAGNOSIS — R293 Abnormal posture: Secondary | ICD-10-CM

## 2019-01-16 DIAGNOSIS — R29898 Other symptoms and signs involving the musculoskeletal system: Secondary | ICD-10-CM

## 2019-01-16 DIAGNOSIS — M5416 Radiculopathy, lumbar region: Secondary | ICD-10-CM | POA: Diagnosis not present

## 2019-01-16 NOTE — Patient Instructions (Signed)
Access Code: [email or web address]  URL: [email or web address]  Date: 01/16/2019  Prepared by: Faustino Congress   Exercises  Supine Piriformis Stretch - 3 reps - 1 sets - 30 sec hold - 2x daily - 7x weekly  Supine Sciatic Nerve Glide - 10 reps - 1 sets - 2x daily - 7x weekly  Supine Single Knee to Chest - 10 reps - 1 sets - 1-2 sec hold - 2x daily - 7x weekly  Sidelying Hip Abduction - 1 sets - 3 reps - 30 seconds pulse - 2x daily - 7x weekly  Bridge with Resistance - 10 reps - 1 sets - 5 sec hold - 2x daily - 7x weekly  Plank - 5 reps - 1 sets - 30 sec hold - 2x daily - 7x weekly

## 2019-01-16 NOTE — Therapy (Signed)
[place], [place], [address] Phone: [phone]   Fax:  [phone]  Physical Therapy Evaluation  Patient Details  Name: Kirsten Herrera MRN: [id number] Date of Birth: 26-Mar-1976 Referring Provider (PT): Dr. Phylliss Bob   Encounter Date: 01/16/2019  PT End of Session - 01/16/19 0918    Visit Number  1    Number of Visits  6    Date for PT Re-Evaluation  02/27/19    PT Start Time  0833    PT Stop Time  0909    PT Time Calculation (min)  36 min    Activity Tolerance  Patient tolerated treatment well    Behavior During Therapy  [facility] for tasks assessed/performed       Past Medical History:  Diagnosis Date  . Anxiety   . Asthma   . Seasonal allergies     Past Surgical History:  Procedure Laterality Date  . ABDOMINAL SURGERY     c-section x 2   . CESAREAN SECTION    . TUBAL LIGATION    . tuval ligation      There were no vitals filed for this visit.   Subjective Assessment - 01/16/19 0837    Subjective  Pt is a 43 y/o female who presents to OPPT with LBP x ~ 2 weeks ago.  Pt reports prior to LBP had began increasing exercise and then began feeling increased pain in low back.  Pt feels pain has gradually been improving and feels she's able to increase her activity daily.    Pertinent History  hx lumbar HNP    Patient Stated Goals  learn exercises to help decrease risk of reinjury    Currently in Pain?  Yes    Pain Score  0-No pain   no pain over past few days   Pain Location  Back    Pain Orientation  Right    Aggravating Factors   forward bending to ~ 30 degrees, sitting, standing    Pain Relieving Factors  stretching, ice, heat         [facility] PT Assessment - 01/16/19 0842      Assessment   Medical Diagnosis  LBP, SIJ dysfunction    Referring Provider (PT)  Dr. Phylliss Bob    Onset Date/Surgical Date  12/29/18    Hand Dominance  Right    Next MD Visit  PRN    Prior  Therapy  at this clinic prior      Precautions   Precautions  None      Restrictions   Weight Bearing Restrictions  No      Balance Screen   Has the patient fallen in the past 6 months  No    Has the patient had a decrease in activity level because of a fear of falling?   No    Is the patient reluctant to leave their home because of a fear of falling?   No      Home Environment   Living Environment  Private residence    Additional Comments  no significant issues with pain with stairs      Prior Function   Level of Independence  Independent    Vocation  Works at [employer] employed    [employer], sewing - [place]  walking, exercise ([employer] style program)      Cognition   Overall Cognitive Status  Within Functional Limits  for tasks assessed      Observation/Other Assessments   Focus on Therapeutic Outcomes (FOTO)   72 (28% limited; predicted 19% limited)      Posture/Postural Control   Posture/Postural Control  Postural limitations    Postural Limitations  Rounded Shoulders;Forward head      ROM / Strength   AROM / PROM / Strength  AROM;Strength      AROM   Overall AROM Comments  lumbar ROM WNL without pain    AROM Assessment Site  --      Strength   Strength Assessment Site  Hip;Knee;Ankle    Right/Left Hip  Right;Left    Right Hip Flexion  5/5    Right Hip Extension  5/5    Right Hip ABduction  4-/5    Left Hip Flexion  5/5    Left Hip Extension  5/5    Left Hip ABduction  5/5    Right/Left Knee  Right;Left    Right Knee Flexion  5/5    Right Knee Extension  5/5    Left Knee Flexion  5/5    Left Knee Extension  5/5    Right/Left Ankle  Right;Left    Right Ankle Dorsiflexion  5/5    Left Ankle Dorsiflexion  5/5      Palpation   Palpation comment  trigger points noted in glutes and piriformis      Special Tests    Special Tests  Lumbar    Lumbar Tests  Slump Test      Slump test   Findings  Positive     Side  Right    Comment  mild symptoms      Ambulation/Gait   Gait Comments  WNL                Objective measurements completed on examination: See above findings.      [facility] Adult PT Treatment/Exercise - 01/16/19 0842      Exercises   Exercises  Lumbar      Lumbar Exercises: Stretches   Piriformis Stretch  Right;2 reps;30 seconds    Other Lumbar Stretch Exercise  hip flexion slides/glides, sciatic nerve glides x 10 reps each      Lumbar Exercises: Supine   Bridge  10 reps;5 seconds    Bridge Limitations  with strap      Lumbar Exercises: Sidelying   Hip Abduction  Right    Hip Abduction Limitations  3 reps; end range pulse x 20 sec             PT Education - 01/16/19 0918    Education Details  HEP    Person(s) Educated  Patient    Methods  Explanation;Demonstration;Handout    Comprehension  Verbalized understanding;Returned demonstration;Need further instruction          PT Long Term Goals - 01/16/19 0921      PT LONG TERM GOAL #1   Title  independent with HEP    Status  New    Target Date  02/27/19      PT LONG TERM GOAL #2   Title  FOTO score improved to </= 19% limited for improved function    Status  New    Target Date  02/27/19      PT LONG TERM GOAL #3   Title  demonstrate negative slump test for improved pain and function    Status  New    Target Date  02/27/19  PT LONG TERM GOAL #4   Title  demonstrate Rt hip abduction strength to 5/5 for improved function and stability    Status  New    Target Date  02/27/19      PT LONG TERM GOAL #5   Title  n/a             Plan - 01/16/19 0908    Clinical Impression Statement  Pt is a 43 y/o female who presents to OPPT for low back and SI pain, which seems to be mostly resolved x 1 week.  Pt demonstrates some neural tension, decreased flexibility and strength and will benefit from PT to address these deficits and decrease risk of reinjury.    Examination-Activity  Limitations  Carry;Stand;Locomotion Level    Examination-Participation Restrictions  Cleaning;Other   exercise   Stability/Clinical Decision Making  Stable/Uncomplicated    Clinical Decision Making  Low    Rehab Potential  Good    PT Frequency  1x / week    PT Duration  6 weeks    PT Treatment/Interventions  ADLs/Self Care Home Management;Cryotherapy;Electrical Stimulation;Moist Heat;Traction;Therapeutic exercise;Therapeutic activities;Functional mobility training;Ultrasound;Patient/family education;Manual techniques;Taping;Dry needling    PT Next Visit Plan  review HEP, add in squats/dead lifts, continue core/hip stabilization    PT Home Exercise Plan  Access Code: [email or web address]    Consulted and Agree with Plan of Care  Patient       Patient will benefit from skilled therapeutic intervention in order to improve the following deficits and impairments:  Pain, Increased fascial restricitons, Increased muscle spasms, Decreased strength, Impaired flexibility  Visit Diagnosis: Lumbar radiculopathy - Plan: PT plan of care cert/re-cert  Other symptoms and signs involving the musculoskeletal system - Plan: PT plan of care cert/re-cert  Abnormal posture - Plan: PT plan of care cert/re-cert     Problem List Patient Active Problem List   Diagnosis Date Noted  . Recurrent pansinusitis 01/02/2017  . Right foot pain 06/12/2016  . Vestibular dizziness involving right inner ear 05/16/2016  . Hypovitaminosis D 04/21/2015  . Chronic fatigue 04/21/2015  . Globus pharyngeus 04/12/2015  . Dysphagia, pharyngoesophageal phase 03/16/2015  . Depression 03/16/2015  . Seasonal allergies 03/16/2015  . Low back pain 04/05/2014      Laureen Abrahams, PT, DPT 01/16/19 9:24 AM     [facility] [place], [place], [address] Phone: [phone]   Fax:  [phone]  Name: Kirsten Herrera MRN: [id number] Date of Birth:  November 10, 1975

## 2019-01-21 ENCOUNTER — Other Ambulatory Visit: Payer: Self-pay

## 2019-01-21 ENCOUNTER — Encounter: Payer: Self-pay | Admitting: Physical Therapy

## 2019-01-21 ENCOUNTER — Ambulatory Visit (INDEPENDENT_AMBULATORY_CARE_PROVIDER_SITE_OTHER): Admitting: Physical Therapy

## 2019-01-21 DIAGNOSIS — M5416 Radiculopathy, lumbar region: Secondary | ICD-10-CM | POA: Diagnosis not present

## 2019-01-21 DIAGNOSIS — R29898 Other symptoms and signs involving the musculoskeletal system: Secondary | ICD-10-CM | POA: Diagnosis not present

## 2019-01-21 DIAGNOSIS — R293 Abnormal posture: Secondary | ICD-10-CM

## 2019-01-21 NOTE — Therapy (Signed)
[place], [place], [address] Phone: [phone]   Fax:  [phone]  Physical Therapy Treatment  Patient Details  Name: Kirsten Herrera MRN: [id number] Date of Birth: 1976-04-09 Referring Provider (PT): Dr. Phylliss Bob   Encounter Date: 01/21/2019  PT End of Session - 01/21/19 1318    Visit Number  2    Number of Visits  6    Date for PT Re-Evaluation  02/27/19    PT Start Time  1230    PT Stop Time  1311    PT Time Calculation (min)  41 min    Activity Tolerance  Patient tolerated treatment well    Behavior During Therapy  [facility] for tasks assessed/performed       Past Medical History:  Diagnosis Date  . Anxiety   . Asthma   . Seasonal allergies     Past Surgical History:  Procedure Laterality Date  . ABDOMINAL SURGERY     c-section x 2   . CESAREAN SECTION    . TUBAL LIGATION    . tuval ligation      There were no vitals filed for this visit.  Subjective Assessment - 01/21/19 1230    Subjective  no pain, c/o stiffness and soreness.  once she's moving and doing stretches she feels much better.    Pertinent History  hx lumbar HNP    Patient Stated Goals  learn exercises to help decrease risk of reinjury    Currently in Pain?  No/denies         [facility] PT Assessment - 01/21/19 1233      Assessment   Medical Diagnosis  LBP, SIJ dysfunction    Referring Provider (PT)  Dr. Phylliss Bob    Onset Date/Surgical Date  12/29/18    Hand Dominance  Right    Next MD Visit  PRN    Prior Therapy  at this clinic prior                   [facility] Adult PT Treatment/Exercise - 01/21/19 1230      Lumbar Exercises: Stretches   Piriformis Stretch  Right;2 reps;30 seconds    Other Lumbar Stretch Exercise  hip flexion slides/glides, sciatic nerve glides x 10 reps each      Lumbar Exercises: Aerobic   Nustep  L5 x 5 min      Lumbar Exercises: Standing   Other Standing Lumbar  Exercises  deadlift 10# x 10 reps    Other Standing Lumbar Exercises  squats 10# bil x 10 reps      Lumbar Exercises: Supine   Bridge  10 reps;5 seconds    Bridge Limitations  with strap    Bridge with [employer] Limitations  bridge walk out x 10 reps      Lumbar Exercises: Sidelying   Hip Abduction  Right    Hip Abduction Limitations  3 reps; end range pulse x 20 sec      Manual Therapy   Manual Therapy  Soft tissue mobilization    Manual therapy comments  skilled palpation and monitoring of soft tissue during DN    Soft tissue mobilization  Rt glutes       Trigger Point Dry Needling - 01/21/19 1317    Consent Given?  Yes    Education Handout Provided  Yes    Muscles Treated Back/Hip  Gluteus medius;Gluteus minimus    Gluteus Minimus Response  Twitch response  elicited;Palpable increased muscle length    Gluteus Medius Response  Twitch response elicited;Palpable increased muscle length           PT Education - 01/21/19 1318    Education Details  updated HEP, DN    Person(s) Educated  Patient    Methods  Explanation;Demonstration;Handout    Comprehension  Verbalized understanding;Returned demonstration          PT Long Term Goals - 01/16/19 0921      PT LONG TERM GOAL #1   Title  independent with HEP    Status  New    Target Date  02/27/19      PT LONG TERM GOAL #2   Title  FOTO score improved to </= 19% limited for improved function    Status  New    Target Date  02/27/19      PT LONG TERM GOAL #3   Title  demonstrate negative slump test for improved pain and function    Status  New    Target Date  02/27/19      PT LONG TERM GOAL #4   Title  demonstrate Rt hip abduction strength to 5/5 for improved function and stability    Status  New    Target Date  02/27/19      PT LONG TERM GOAL #5   Title  n/a            Plan - 01/21/19 1318    Clinical Impression Statement  Pt continues to report no pain, only tightness and stiffness.  Able to  progress strengthening program today and trialed DN to see if pt has benefit.  Will continue to benefit from PT to maximize function.  May only need 1-2 more sessions.    Examination-Activity Limitations  Carry;Stand;Locomotion Level    Examination-Participation Restrictions  Cleaning;Other   exercise   Stability/Clinical Decision Making  Stable/Uncomplicated    Rehab Potential  Good    PT Frequency  1x / week    PT Duration  6 weeks    PT Treatment/Interventions  ADLs/Self Care Home Management;Cryotherapy;Electrical Stimulation;Moist Heat;Traction;Therapeutic exercise;Therapeutic activities;Functional mobility training;Ultrasound;Patient/family education;Manual techniques;Taping;Dry needling    PT Next Visit Plan  review HEP, assess response to DN/manual, check goals and see if pt ready for d/c or hold    PT Home Exercise Plan  Access Code: [email or web address]    Consulted and Agree with Plan of Care  Patient       Patient will benefit from skilled therapeutic intervention in order to improve the following deficits and impairments:  Pain, Increased fascial restricitons, Increased muscle spasms, Decreased strength, Impaired flexibility  Visit Diagnosis: 1. Lumbar radiculopathy   2. Other symptoms and signs involving the musculoskeletal system   3. Abnormal posture        Problem List Patient Active Problem List   Diagnosis Date Noted  . Recurrent pansinusitis 01/02/2017  . Right foot pain 06/12/2016  . Vestibular dizziness involving right inner ear 05/16/2016  . Hypovitaminosis D 04/21/2015  . Chronic fatigue 04/21/2015  . Globus pharyngeus 04/12/2015  . Dysphagia, pharyngoesophageal phase 03/16/2015  . Depression 03/16/2015  . Seasonal allergies 03/16/2015  . Low back pain 04/05/2014      Laureen Abrahams, PT, DPT 01/21/19 1:21 PM     [facility] [place], [place], [address] Phone: [phone]   Fax:   [phone]  Name: Kirsten Herrera MRN: [id number] Date of Birth: May 16, 1976

## 2019-01-21 NOTE — Patient Instructions (Signed)
Access Code: [email or web address]  URL: [email or web address]  Date: 01/21/2019  Prepared by: Faustino Congress   Exercises  Supine Piriformis Stretch - 3 reps - 1 sets - 30 sec hold - 2x daily - 7x weekly  Supine Sciatic Nerve Glide - 10 reps - 1 sets - 2x daily - 7x weekly  Supine Single Knee to Chest - 10 reps - 1 sets - 1-2 sec hold - 2x daily - 7x weekly  Sidelying Hip Abduction - 1 sets - 3 reps - 30 seconds pulse - 2x daily - 7x weekly  Bridge with Resistance - 10 reps - 1 sets - 5 sec hold - 2x daily - 7x weekly  Plank - 5 reps - 1 sets - 30 sec hold - 2x daily - 7x weekly  Bridge Walk Out - 10 reps - 1 sets - 2x daily - 7x weekly  Half Dead Lift with Kettlebell - 10 reps - 1 sets - 2x daily - 7x weekly  Quarter Squat with Dumbbells - 10 reps - 2 sets - 2x daily - 7x weekly  Patient Education  Trigger Point Dry Needling

## 2019-01-28 ENCOUNTER — Ambulatory Visit (INDEPENDENT_AMBULATORY_CARE_PROVIDER_SITE_OTHER): Admitting: Physical Therapy

## 2019-01-28 ENCOUNTER — Encounter: Payer: Self-pay | Admitting: Physical Therapy

## 2019-01-28 ENCOUNTER — Other Ambulatory Visit: Payer: Self-pay

## 2019-01-28 DIAGNOSIS — R29898 Other symptoms and signs involving the musculoskeletal system: Secondary | ICD-10-CM

## 2019-01-28 DIAGNOSIS — M5416 Radiculopathy, lumbar region: Secondary | ICD-10-CM | POA: Diagnosis not present

## 2019-01-28 DIAGNOSIS — R293 Abnormal posture: Secondary | ICD-10-CM

## 2019-01-28 NOTE — Therapy (Addendum)
[place], [place], [address] Phone: [phone]   Fax:  [phone]  Physical Therapy Treatment/Discharge  Patient Details  Name: Kirsten Herrera MRN: [id number] Date of Birth: [birth year] Referring Provider (PT): Dr. Phylliss Bob   Encounter Date: 01/28/2019  PT End of Session - 01/28/19 1331    Visit Number  3    Number of Visits  6    Date for PT Re-Evaluation  02/27/19    PT Start Time  1227    PT Stop Time  1300    PT Time Calculation (min)  33 min    Activity Tolerance  Patient tolerated treatment well    Behavior During Therapy  [facility] for tasks assessed/performed       Past Medical History:  Diagnosis Date  . Anxiety   . Asthma   . Seasonal allergies     Past Surgical History:  Procedure Laterality Date  . ABDOMINAL SURGERY     c-section x 2   . CESAREAN SECTION    . TUBAL LIGATION    . tuval ligation      There were no vitals filed for this visit.  Subjective Assessment - 01/28/19 1231    Subjective  doing well, woke up today without any pain.  stiffness in AM which resolves in about 15 min.    Pertinent History  hx lumbar HNP    Patient Stated Goals  learn exercises to help decrease risk of reinjury    Currently in Pain?  No/denies         [facility] PT Assessment - 01/28/19 1240      Assessment   Medical Diagnosis  LBP, SIJ dysfunction    Referring Provider (PT)  Dr. Phylliss Bob    Onset Date/Surgical Date  12/29/18      Observation/Other Assessments   Focus on Therapeutic Outcomes (FOTO)   94 (6% limited)      Strength   Right Hip ABduction  5/5                   [facility] Adult PT Treatment/Exercise - 01/28/19 1232      Self-Care   Self-Care  Other Self-Care Comments    Other Self-Care Comments   educated on gradual return to exercise, activity modifications and start light.  pt verbalized understanding      Lumbar Exercises: Aerobic   Nustep  L5  x 5 min      Lumbar Exercises: Standing   Other Standing Lumbar Exercises  bootcamp style exercises including: lunges, lateral lunges, squats and squat jumps, curtsey lunges, mountain climbers and burpees 5-10 reps each without increase in pain             PT Education - 01/28/19 1330    Education Details  see self care    Person(s) Educated  Patient    Methods  Explanation;Demonstration    Comprehension  Verbalized understanding          PT Long Term Goals - 01/28/19 1331      PT LONG TERM GOAL #1   Title  independent with HEP    Status  Achieved      PT LONG TERM GOAL #2   Title  FOTO score improved to </= 19% limited for improved function    Status  Achieved      PT LONG TERM GOAL #3   Title  demonstrate negative slump test for improved pain and function  Status  Achieved      PT LONG TERM GOAL #4   Title  demonstrate Rt hip abduction strength to 5/5 for improved function and stability    Status  Achieved      PT LONG TERM GOAL #5   Title  n/a            Plan - 01/28/19 1332    Clinical Impression Statement  Pt has met all goals, plan to hold PT as pt transitions into regular exercise routine.  Will hold PT and pt will return if needed.    Examination-Activity Limitations  Carry;Stand;Locomotion Level    Examination-Participation Restrictions  Cleaning;Other   exercise   Stability/Clinical Decision Making  Stable/Uncomplicated    Rehab Potential  Good    PT Frequency  1x / week    PT Duration  6 weeks    PT Treatment/Interventions  ADLs/Self Care Home Management;Cryotherapy;Electrical Stimulation;Moist Heat;Traction;Therapeutic exercise;Therapeutic activities;Functional mobility training;Ultrasound;Patient/family education;Manual techniques;Taping;Dry needling    PT Next Visit Plan  hold x 30 days, reassess or d/c    PT Home Exercise Plan  Access Code: [email or web address]    Consulted and Agree with Plan of Care  Patient       Patient will benefit from  skilled therapeutic intervention in order to improve the following deficits and impairments:  Pain, Increased fascial restricitons, Increased muscle spasms, Decreased strength, Impaired flexibility  Visit Diagnosis: 1. Lumbar radiculopathy   2. Other symptoms and signs involving the musculoskeletal system   3. Abnormal posture        Problem List Patient Active Problem List   Diagnosis Date Noted  . Recurrent pansinusitis 01/02/2017  . Right foot pain 06/12/2016  . Vestibular dizziness involving right inner ear 05/16/2016  . Hypovitaminosis D 04/21/2015  . Chronic fatigue 04/21/2015  . Globus pharyngeus 04/12/2015  . Dysphagia, pharyngoesophageal phase 03/16/2015  . Depression 03/16/2015  . Seasonal allergies 03/16/2015  . Low back pain 04/05/2014      Laureen Abrahams, PT, DPT 01/28/19 1:34 PM    [facility] [place], [place], [address] Phone: [phone]   Fax:  [phone]  Name: Kirsten Herrera MRN: [id number] Date of Birth: [birth year]     PHYSICAL THERAPY DISCHARGE SUMMARY  Visits from Start of Care: 3  Current functional level related to goals / functional outcomes: See above   Remaining deficits: See above   Education / Equipment: HEP  Plan: Patient agrees to discharge.  Patient goals were met. Patient is being discharged due to meeting the stated rehab goals.  ?????    Laureen Abrahams, PT, DPT 03/02/19 11:36 AM  [facility] [place], [place] [address]  [phone] (office) [phone] (fax)

## 2019-02-02 ENCOUNTER — Ambulatory Visit: Admitting: Family Medicine

## 2019-03-09 IMAGING — DX DG SINUSES COMPLETE 3+V
3 series · 3 of 3 positions shown · non-contrast
Comparison: 09/23/2013

CLINICAL DATA: Recurrent sinus infections. Symptoms over the last 4
weeks without relief.

EXAM:
PARANASAL SINUSES - COMPLETE 3 + VIEW

[pns waters]
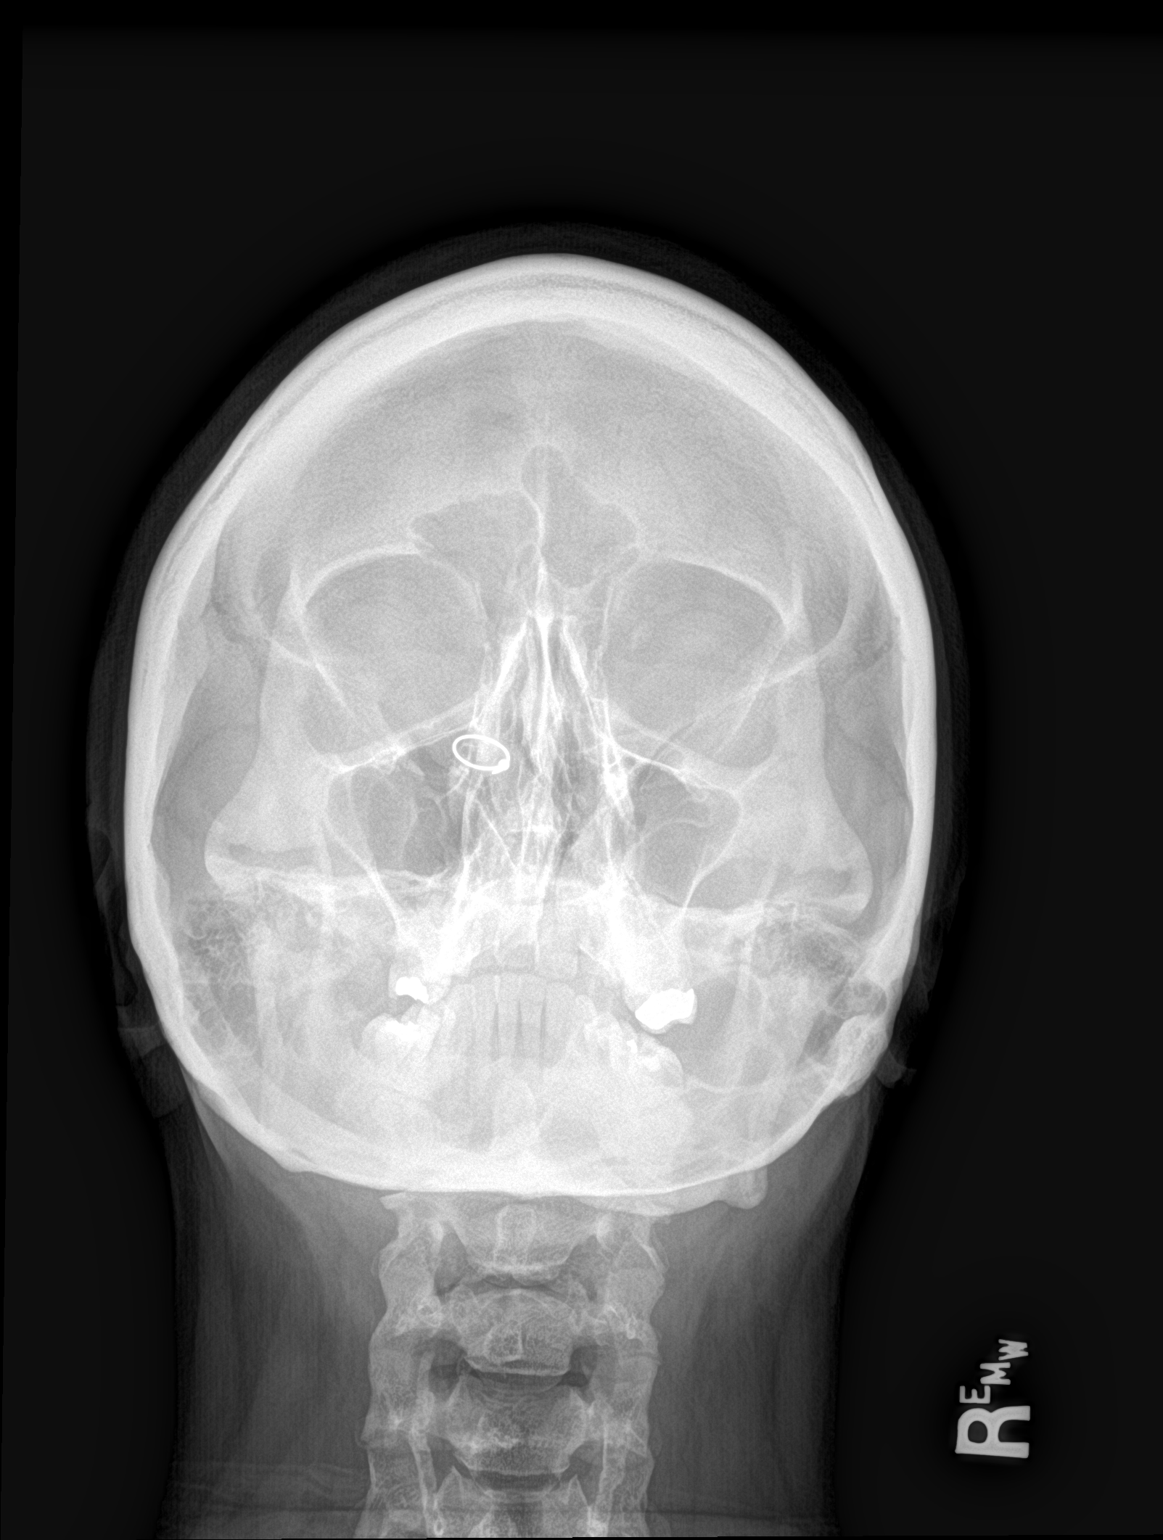

[[person_name]]
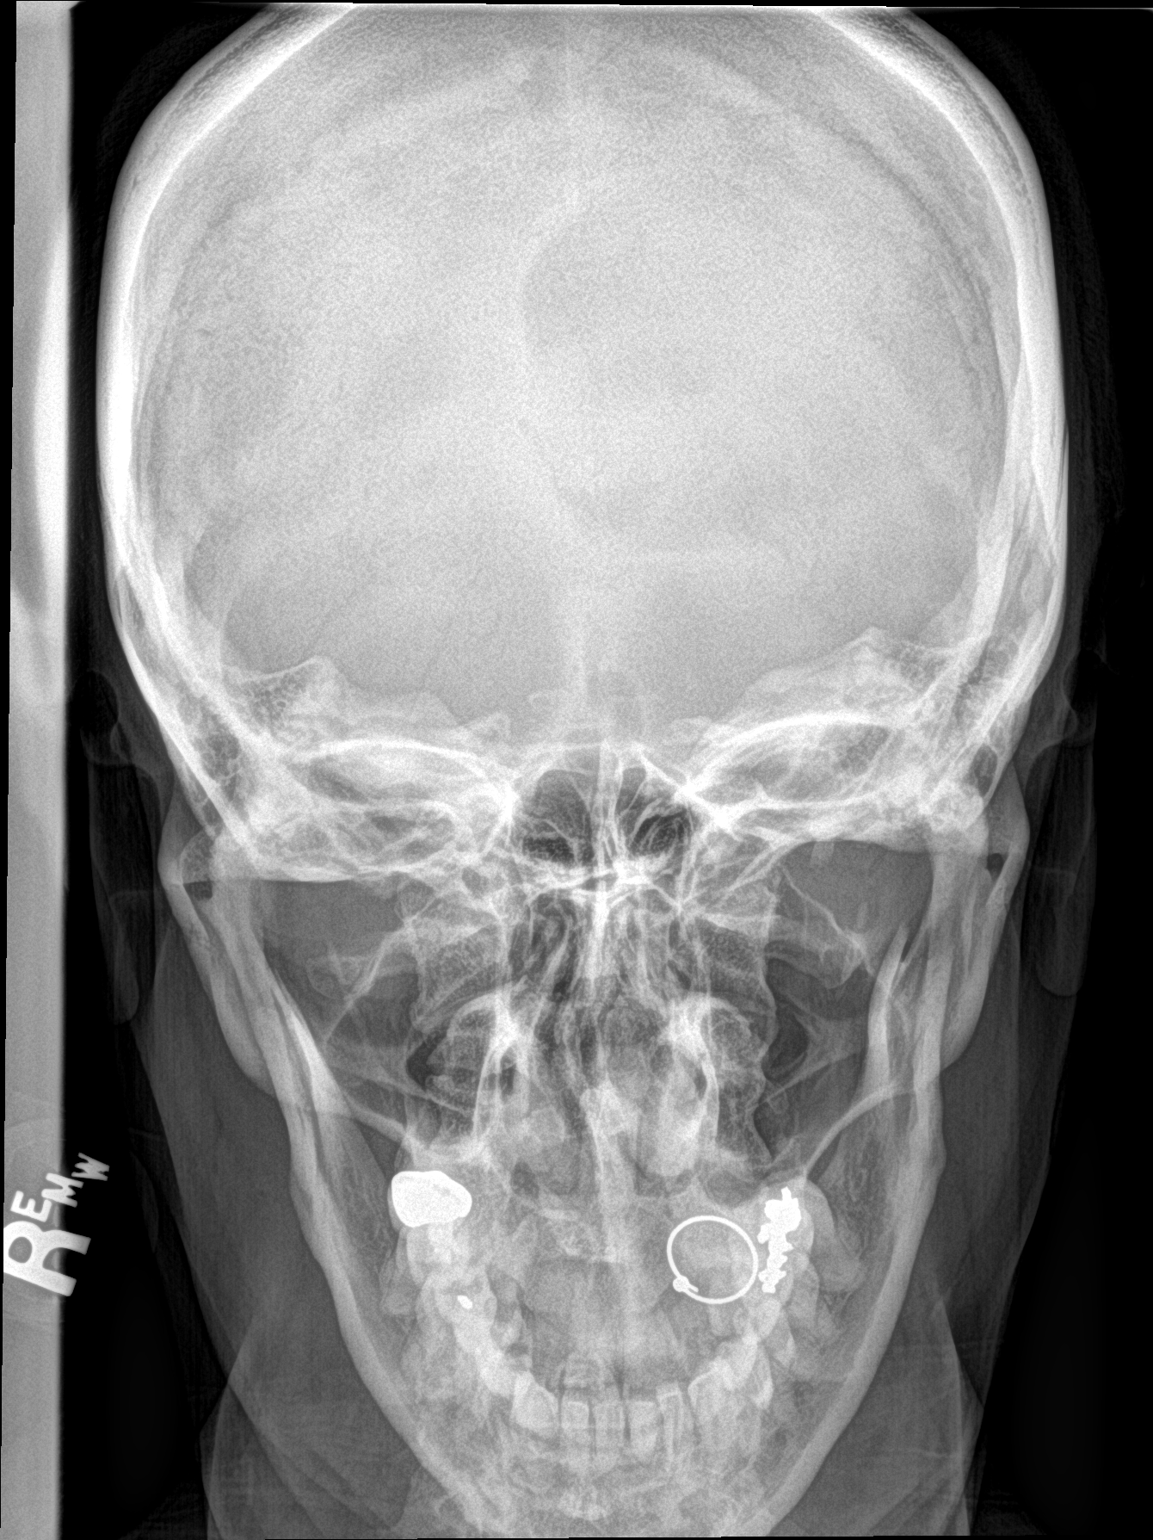

[pns lat]
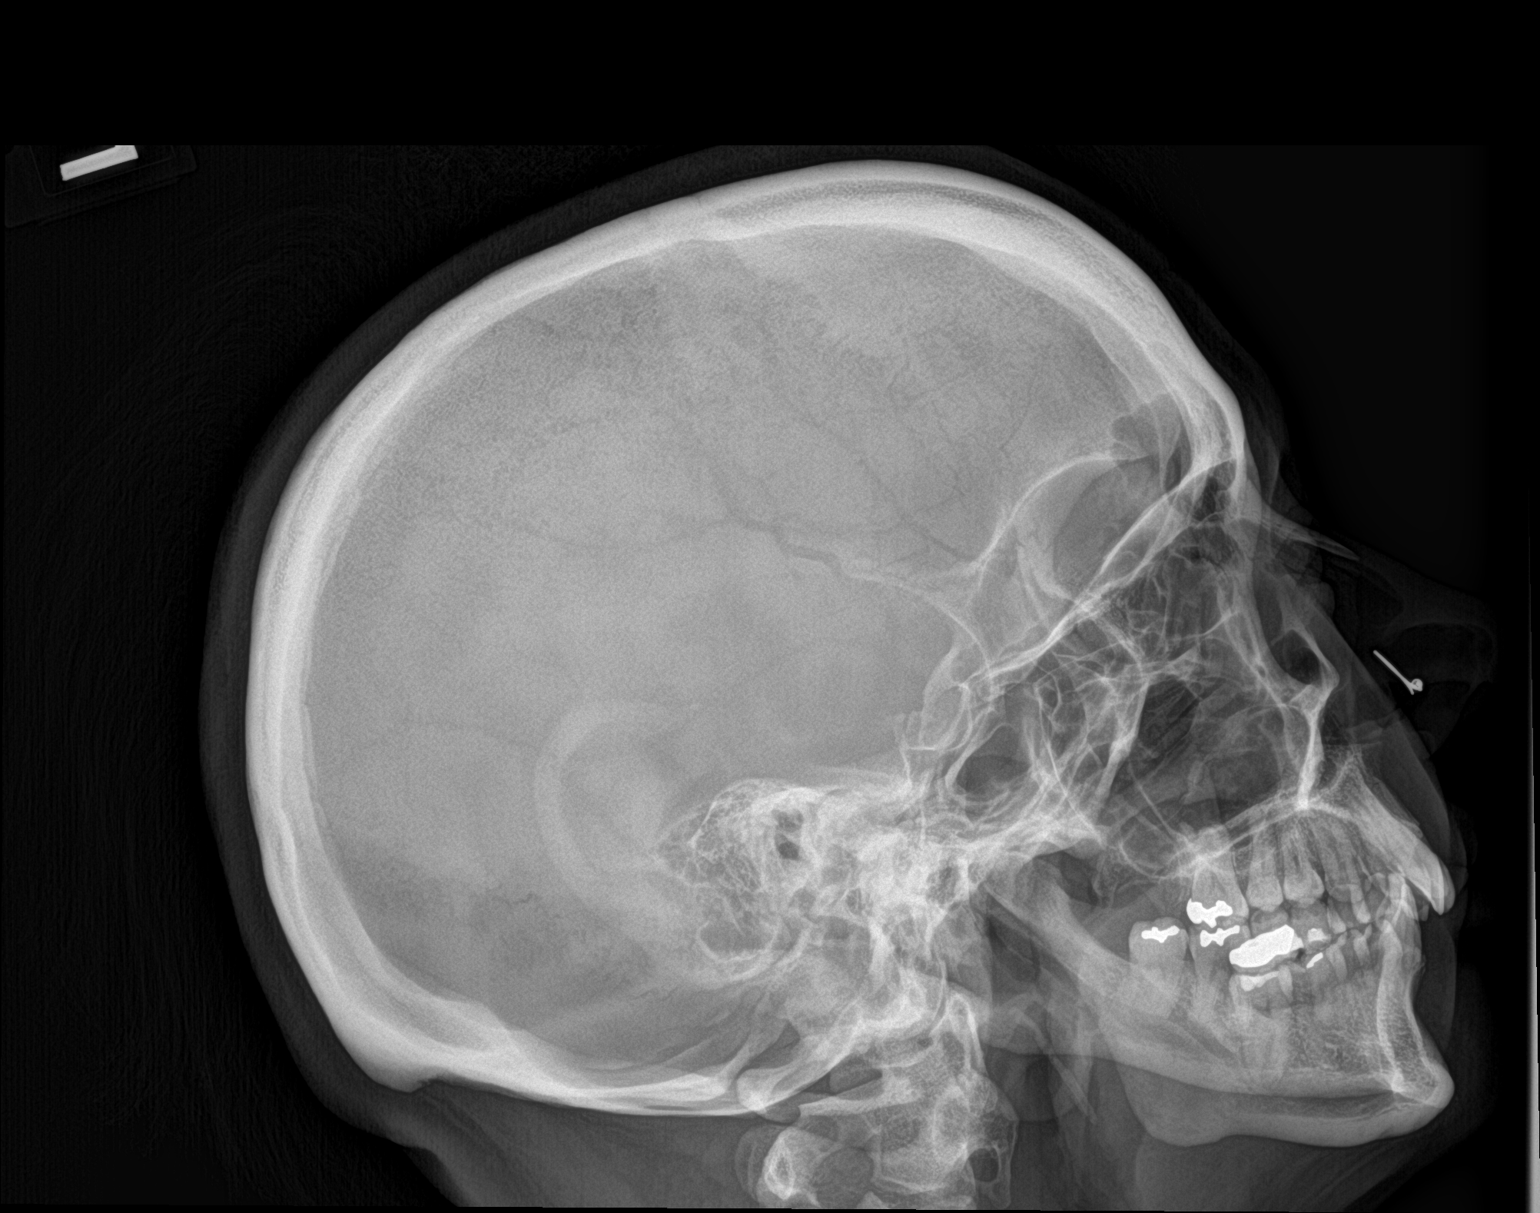

[3 of 3 positions shown; findings below may reference images not displayed]

FINDINGS: Paranasal sinuses appear clear without evidence of opacification or
fluid level. CT would be more accurate for minor findings.
IMPRESSION: No evidence of sinusitis by radiography.

## 2019-04-22 ENCOUNTER — Encounter: Payer: Self-pay | Admitting: *Deleted

## 2019-04-22 ENCOUNTER — Emergency Department (INDEPENDENT_AMBULATORY_CARE_PROVIDER_SITE_OTHER)

## 2019-04-22 ENCOUNTER — Other Ambulatory Visit: Payer: Self-pay

## 2019-04-22 ENCOUNTER — Emergency Department (INDEPENDENT_AMBULATORY_CARE_PROVIDER_SITE_OTHER)
Admission: EM | Admit: 2019-04-22 | Discharge: 2019-04-22 | Disposition: A | Discharge status: Home / Self Care | Source: Home / Self Care | Attending: Family Medicine | Admitting: Family Medicine

## 2019-04-22 DIAGNOSIS — R0781 Pleurodynia: Secondary | ICD-10-CM

## 2019-04-22 DIAGNOSIS — M940 Chondrocostal junction syndrome [Tietze]: Secondary | ICD-10-CM

## 2019-04-22 LAB — POCT URINALYSIS DIP (MANUAL ENTRY)
Bilirubin, UA: NEGATIVE
Blood, UA: NEGATIVE
Glucose, UA: NEGATIVE mg/dL
Ketones, POC UA: NEGATIVE mg/dL
Leukocytes, UA: NEGATIVE
Nitrite, UA: NEGATIVE
Protein Ur, POC: NEGATIVE mg/dL
Spec Grav, UA: 1.01
Urobilinogen, UA: 0.2 U/dL
pH, UA: 5

## 2019-04-22 MED ORDER — HYDROCODONE-ACETAMINOPHEN 5-325 MG PO TABS: ORAL_TABLET | ORAL | 0 refills | Status: DC

## 2019-04-22 MED ORDER — KETOROLAC TROMETHAMINE 60 MG/2ML IM SOLN
60.0000 mg | Freq: Once | INTRAMUSCULAR | Status: AC
  Administered 2019-04-22: 60 mg via INTRAMUSCULAR

## 2019-04-22 MED ORDER — PREDNISONE 20 MG PO TABS: ORAL_TABLET | ORAL | 0 refills | Status: DC

## 2019-04-22 NOTE — ED Triage Notes (Signed)
Pt c/o LT flank pain x 4 days.

## 2019-04-22 NOTE — Discharge Instructions (Addendum)
Apply ice pack for 20 to 30 minutes, 3 to 4 times daily  Continue until pain decreases.  °

## 2019-04-22 NOTE — ED Provider Notes (Signed)
Kirsten Herrera CARE    CSN: [id number] Arrival date & time: 04/22/19  1248      History   Chief Complaint Chief Complaint  Patient presents with   Flank Pain    HPI Kirsten Herrera is a 43 y.o. female.   Patient complains of onset of stabbing left flank pain 4 days ago, and wonders if she could have a kidney stone although she has had no urinary symptoms.  She has had mild nausea (without vomiting) and chills but no fever.  She notes that application of heat helps.  The history is provided by the patient.  Flank Pain This is a new problem. Episode onset: 4 days ago. The problem occurs constantly. The problem has been gradually worsening. Pertinent negatives include no chest pain, no abdominal pain and no shortness of breath. The symptoms are aggravated by walking and twisting. Nothing relieves the symptoms. Treatments tried: heating pad and ibuprofen. The treatment provided mild relief.    Past Medical History:  Diagnosis Date   Anxiety    Asthma    Seasonal allergies     Patient Active Problem List   Diagnosis Date Noted   Recurrent pansinusitis 01/02/2017   Right foot pain 06/12/2016   Vestibular dizziness involving right inner ear 05/16/2016   Hypovitaminosis D 04/21/2015   Chronic fatigue 04/21/2015   Globus pharyngeus 04/12/2015   Dysphagia, pharyngoesophageal phase 03/16/2015   Depression 03/16/2015   Seasonal allergies 03/16/2015   Low back pain 04/05/2014    Past Surgical History:  Procedure Laterality Date   ABDOMINAL SURGERY     c-section x 2    CESAREAN SECTION     TUBAL LIGATION     tuval ligation      OB History   No obstetric history on file.      Home Medications    Prior to Admission medications   Medication Sig Start Date End Date Taking? Authorizing Provider  amitriptyline (ELAVIL) 10 MG tablet  05/15/17   [provider]  HYDROcodone-acetaminophen (NORCO/VICODIN) 5-325 MG tablet Take one by mouth at  bedtime as needed for pain 04/22/19   Kandra Nicolas, MD  lamoTRIgine (LAMICTAL) 100 MG tablet  05/15/17   [provider]  predniSONE (DELTASONE) 20 MG tablet Take one tab by mouth twice daily for 4 days, then one daily. Take with food. 04/22/19   Kandra Nicolas, MD    Family History Family History  Problem Relation Age of Onset   Parkinson's disease Father        Grandmother said Dad had Wolf Parkinson White Disease    Cancer Other        throat   Diabetes Other    Stroke Other     Social History Social History   Tobacco Use   Smoking status: Former Smoker    Quit date: 08/17/2005    Years since quitting: 13.6   Smokeless tobacco: Never Used  Substance Use Topics   Alcohol use: Yes    Alcohol/week: 0.0 standard drinks   Drug use: No     Allergies   Patient has no known allergies.   Review of Systems Review of Systems  Constitutional: Positive for activity change, appetite change and chills. Negative for fatigue and fever.  HENT: Negative.   Eyes: Negative.   Respiratory: Negative.  Negative for shortness of breath.   Cardiovascular: Negative.  Negative for chest pain.  Gastrointestinal: Positive for nausea. Negative for abdominal pain, diarrhea and vomiting.  Genitourinary:  Positive for flank pain. Negative for dysuria, frequency, hematuria, pelvic pain and urgency.  Skin: Negative.      Physical Exam Triage Vital Signs ED Triage Vitals  Enc Vitals Group     BP 04/22/19 1305 (!) 150/96     Pulse Rate 04/22/19 1305 73     Resp 04/22/19 1305 18     Temp 04/22/19 1305 98 F (36.7 C)     Temp Source 04/22/19 1305 Oral     SpO2 04/22/19 1305 100 %     Weight --      Height --      Head Circumference --      Peak Flow --      Pain Score 04/22/19 1306 10     Pain Loc --      Pain Edu? --      Excl. in [place]? --    No data found.  Updated Vital Signs BP (!) 150/96 (BP Location: Right Arm)    Pulse 73    Temp 98 F (36.7 C) (Oral)     Resp 18    LMP 04/08/2019    SpO2 100%   Visual Acuity Right Eye Distance:   Left Eye Distance:   Bilateral Distance:    Right Eye Near:   Left Eye Near:    Bilateral Near:     Physical Exam Vitals signs and nursing note reviewed.  Constitutional:      General: She is not in acute distress. HENT:     Head: Normocephalic.     Right Ear: External ear normal.     Left Ear: External ear normal.     Nose: Nose normal.     Mouth/Throat:     Pharynx: Oropharynx is clear.  Eyes:     Conjunctiva/sclera: Conjunctivae normal.     Pupils: Pupils are equal, round, and reactive to light.  Cardiovascular:     Heart sounds: Normal heart sounds.  Pulmonary:     Breath sounds: Normal breath sounds.    Chest:       Comments: Chest:  Distinct diffuse tenderness to palpation over the left lateral/inferior ribs extending to the inferior sternum. Abdominal:     Palpations: Abdomen is soft.     Tenderness: There is no abdominal tenderness.  Musculoskeletal:     Right lower leg: No edema.     Left lower leg: No edema.  Lymphadenopathy:     Cervical: No cervical adenopathy.  Skin:    General: Skin is warm and dry.     Findings: No rash.  Neurological:     Mental Status: She is alert.      UC Treatments / Results  Labs (all labs ordered are listed, but only abnormal results are displayed) Labs Reviewed  POCT URINALYSIS DIP (MANUAL ENTRY) - Abnormal; Notable for the following components:      Result Value   Clarity, UA cloudy (*)    All other components within normal limits    EKG   Radiology Dg Ribs Unilateral W/chest Left  Result Date: 04/22/2019 CLINICAL DATA:  Left-sided rib pain x4 days. EXAM: LEFT RIBS AND CHEST - 3+ VIEW COMPARISON:  July 20, 2015 FINDINGS: No fracture or other bone lesions are seen involving the ribs. There is no evidence of pneumothorax or pleural effusion. Both lungs are clear. Heart size and mediastinal contours are within normal limits.  IMPRESSION: Negative. Electronically Signed   By: Constance Holster M.D.   On: 04/22/2019 13:58  Procedures Procedures (including critical care time)  Medications Ordered in UC Medications  ketorolac (TORADOL) injection 60 mg (has no administration in time range)    Initial Impression / Assessment and Plan / UC Course  I have reviewed the triage vital signs and the nursing notes.  Pertinent labs & imaging results that were available during my care of the patient were reviewed by me and considered in my medical decision making (see chart for details).    Negative urinalysis and normal chest x-ray reassuring. Administered Toradol 60mg  IM  Begin prednisone burst/taper. Rx for Lortab at bedtime (#5, no refill). Controlled Substance Prescriptions I have consulted the [place] Controlled Substances Registry for this patient, and feel the risk/benefit ratio today is favorable for proceeding with this prescription for a controlled substance.   Followup with Family Doctor if not improved in two weeks.   Final Clinical Impressions(s) / UC Diagnoses   Final diagnoses:  Costochondritis     Discharge Instructions     Apply ice pack for 20 to 30 minutes, 3 to 4 times daily  Continue until pain decreases.     ED Prescriptions    Medication Sig Dispense Auth. Provider   predniSONE (DELTASONE) 20 MG tablet Take one tab by mouth twice daily for 4 days, then one daily. Take with food. 12 tablet Kandra Nicolas, MD   HYDROcodone-acetaminophen (NORCO/VICODIN) 5-325 MG tablet Take one by mouth at bedtime as needed for pain 5 tablet Kandra Nicolas, MD        Kandra Nicolas, MD 04/25/19 2022

## 2019-08-13 DIAGNOSIS — U071 COVID-19: Secondary | ICD-10-CM

## 2019-08-13 HISTORY — DX: COVID-19: U07.1

## 2020-04-25 ENCOUNTER — Encounter: Payer: Self-pay | Admitting: Osteopathic Medicine

## 2020-04-25 ENCOUNTER — Ambulatory Visit (INDEPENDENT_AMBULATORY_CARE_PROVIDER_SITE_OTHER): Admitting: Osteopathic Medicine

## 2020-04-25 ENCOUNTER — Other Ambulatory Visit: Payer: Self-pay

## 2020-04-25 VITALS — BP 129/89 | HR 73 | Ht 67.0 in | Wt 160.0 lb

## 2020-04-25 DIAGNOSIS — K582 Mixed irritable bowel syndrome: Secondary | ICD-10-CM

## 2020-04-25 DIAGNOSIS — F419 Anxiety disorder, unspecified: Secondary | ICD-10-CM | POA: Diagnosis not present

## 2020-04-25 DIAGNOSIS — R5382 Chronic fatigue, unspecified: Secondary | ICD-10-CM

## 2020-04-25 DIAGNOSIS — Z0000 Encounter for general adult medical examination without abnormal findings: Secondary | ICD-10-CM

## 2020-04-25 DIAGNOSIS — N926 Irregular menstruation, unspecified: Secondary | ICD-10-CM

## 2020-04-25 NOTE — Patient Instructions (Addendum)
  2017 UpToDate Characteristics and sources of common FODMAPs  Word that corresponds to letter in acronym Compounds in this category Foods that contain these compounds  F Fermentable  O Oligosaccharides Fructans, galacto-oligosaccharides Wheat, barley, rye, onion, leek, white part of spring onion, garlic, shallots, artichokes, beetroot, fennel, peas, chicory, pistachio, cashews, legumes, lentils, and chickpeas  D Disaccharides Lactose Milk, custard, ice cream, and yogurt  M Monosaccharides "Free fructose" (fructose in excess of glucose) Apples, pears, mangoes, cherries, watermelon, asparagus, sugar snap peas, honey, high-fructose corn syrup  A And  P Polyols Sorbitol, mannitol, maltitol, and xylitol Apples, pears, apricots, cherries, nectarines, peaches, plums, watermelon, mushrooms, cauliflower, artificially sweetened chewing gum and confectionery  FODMAPs: fermentable oligosaccharides, disaccharides, monosaccharides, and polyols. Adapted by permission from [employer]: American Journal of Gastroenterology. Shepherd SJ, Lomer MC, [place]. Short-chain carbohydrates and functional gastrointestinal disorders. Am J Gastroenterol 2013; 108:707. Copyright  2013. www.nature.com/ajg. Graphic 90186 Version 2.0  ......... 

## 2020-04-25 NOTE — Progress Notes (Signed)
Kirsten Herrera is a 44 y.o. female who presents to  [place] at [facility]  today, 04/25/20, seeking care for the following:  . GI concerns  . Palpitations  . Mental health      ASSESSMENT & PLAN with other pertinent history and findings:  The primary encounter diagnosis was Irritable bowel syndrome with both constipation and diarrhea. Diagnoses of Palpitations, Annual physical exam, Chronic fatigue, Anxiety, and Irregular periods were also pertinent to this visit.   Annual physical exam Labs ordered for future visit. Annual physical / preventive care was NOT performed or billed today.   Chronic fatigue Labs as below  Irritable bowel syndrome with both constipation and diarrhea Several visit w/ GI, feels "they're just given me meds" and not helpful. Has tried Bentyl, PPI. Reports (+)diarrhea alternating w/ constipation, (+)bloating, (+)occasional GERD. Worse w/ increased anxiety past few months. Normal GI exam. --> treat as IBS, trial low FODMAP, ok to conintue Bentul and Imodium prior to meals, add fiber   Anxiety w/ Palpitations  Following w/ psychiatry - taking Elavil, Lamictal, Temazepam, Alprazolam. GI symptoms worse over past several months w/ increased stress - likely an association/relation. Normal cardiac exam. No CP on exertion, no SOB. --> ask psychiatry re: Rx   Irregular periods No heavy bleeding. Pt is concernedfor perimenopause especially w/ irritability and GI issues          Labs ordered for future visit. Annual physical / preventive care was NOT performed or billed today.     Patient Instructions    2017 UpToDate Characteristics and sources of common FODMAPs  Word that corresponds to letter in acronym Compounds in this category Foods that contain these compounds  F Fermentable  O Oligosaccharides Fructans, galacto-oligosaccharides  Wheat, barley, rye, onion, leek, white part of spring onion, garlic,  shallots, artichokes, beetroot, fennel, peas, chicory, pistachio, cashews, legumes, lentils, and chickpeas    D Disaccharides Lactose Milk, custard, ice cream, and yogurt    M Monosaccharides "Free fructose" (fructose in excess of glucose) Apples, pears, mangoes, cherries, watermelon, asparagus, sugar snap peas, honey, high-fructose corn syrup    A And  P Polyols Sorbitol, mannitol, maltitol, and xylitol Apples, pears, apricots, cherries, nectarines, peaches, plums, watermelon, mushrooms, cauliflower, artificially sweetened chewing gum and confectionery    FODMAPs: fermentable oligosaccharides, disaccharides, monosaccharides, and polyols. Adapted by permission from [employer]: [employer] of Gastroenterology. Agustin Cree, Lomer MC, [employer] [place]. Short-chain carbohydrates and functional gastrointestinal disorders. Am J Gastroenterol 2013; 108:707. Copyright  2013. www.nature.com/ajg. Graphic [phone] Version 2.0        Orders Placed This Encounter  Procedures  . CBC with Differential/Platelet  . COMPLETE METABOLIC PANEL WITH GFR  . TSH  . FSH/LH  . Lipid panel    No orders of the defined types were placed in this encounter.      Follow-up instructions: Return for recheck GI issues in 4-6 weeks .                                         BP 129/89 (BP Location: Right Arm, Patient Position: Sitting)   Pulse 73   Ht 5\' 7"  (1.702 m)   Wt 160 lb (72.6 kg)   SpO2 100%   BMI 25.06 kg/m   Current Meds  Medication Sig  . amitriptyline (ELAVIL) 10 MG tablet   . lamoTRIgine (  LAMICTAL) 100 MG tablet     No results found for this or any previous visit (from the past 72 hour(s)).  No results found.     All questions at time of visit were answered - patient instructed to contact office with any additional concerns or updates.  ER/RTC precautions were reviewed with the patient as applicable.   Please note: voice  recognition software was used to produce this document, and typos may escape review. Please contact Dr. Sheppard Coil for any needed clarifications.   Total encounter time: 30 minutes.

## 2020-04-26 LAB — CBC WITH DIFFERENTIAL/PLATELET
Absolute Monocytes: 514 cells/uL (ref 200–950)
Basophils Absolute: 21 cells/uL (ref 0–200)
Basophils Relative: 0.4 %
Eosinophils Absolute: 58 cells/uL (ref 15–500)
Eosinophils Relative: 1.1 %
HCT: 39.7 % (ref 35.0–45.0)
Hemoglobin: 13.3 g/dL (ref 11.7–15.5)
Lymphs Abs: 1595 cells/uL (ref 850–3900)
MCH: 31.2 pg (ref 27.0–33.0)
MCHC: 33.5 g/dL (ref 32.0–36.0)
MCV: 93.2 fL (ref 80.0–100.0)
MPV: 9.7 fL (ref 7.5–12.5)
Monocytes Relative: 9.7 %
Neutro Abs: 3111 cells/uL (ref 1500–7800)
Neutrophils Relative %: 58.7 %
Platelets: 350 10*3/uL (ref 140–400)
RBC: 4.26 10*6/uL (ref 3.80–5.10)
RDW: 11.8 % (ref 11.0–15.0)
Total Lymphocyte: 30.1 %
WBC: 5.3 10*3/uL (ref 3.8–10.8)

## 2020-04-26 LAB — COMPLETE METABOLIC PANEL WITH GFR
AG Ratio: 1.5 (calc) (ref 1.0–2.5)
ALT: 12 U/L (ref 6–29)
AST: 14 U/L (ref 10–30)
Albumin: 4.2 g/dL (ref 3.6–5.1)
Alkaline phosphatase (APISO): 44 U/L (ref 31–125)
BUN: 13 mg/dL (ref 7–25)
CO2: 28 mmol/L (ref 20–32)
Calcium: 9.3 mg/dL (ref 8.6–10.2)
Chloride: 102 mmol/L (ref 98–110)
Creat: 0.81 mg/dL (ref 0.50–1.10)
GFR, Est African American: 103 mL/min/{1.73_m2} (ref >=60)
GFR, Est Non African American: 89 mL/min/{1.73_m2} (ref >=60)
Globulin: 2.8 g/dL (calc) (ref 1.9–3.7)
Glucose, Bld: 92 mg/dL (ref 65–139)
Potassium: 4.2 mmol/L (ref 3.5–5.3)
Sodium: 137 mmol/L (ref 135–146)
Total Bilirubin: 0.5 mg/dL (ref 0.2–1.2)
Total Protein: 7 g/dL (ref 6.1–8.1)

## 2020-04-26 LAB — LIPID PANEL
Cholesterol: 182 mg/dL (ref <200)
HDL: 61 mg/dL (ref >=50)
LDL Cholesterol (Calc): 99 mg/dL (calc)
Non-HDL Cholesterol (Calc): 121 mg/dL (calc) (ref <130)
Total CHOL/HDL Ratio: 3 (calc) (ref <5.0)
Triglycerides: 127 mg/dL (ref <150)

## 2020-04-26 LAB — FSH/LH
FSH: 7.4 m[IU]/mL
LH: 12.6 m[IU]/mL

## 2020-04-26 LAB — TSH: TSH: 1.94 mIU/L

## 2020-05-23 ENCOUNTER — Ambulatory Visit (INDEPENDENT_AMBULATORY_CARE_PROVIDER_SITE_OTHER): Admitting: Osteopathic Medicine

## 2020-05-23 ENCOUNTER — Encounter: Payer: Self-pay | Admitting: Osteopathic Medicine

## 2020-05-23 ENCOUNTER — Other Ambulatory Visit: Payer: Self-pay | Admitting: Osteopathic Medicine

## 2020-05-23 ENCOUNTER — Other Ambulatory Visit: Payer: Self-pay

## 2020-05-23 VITALS — BP 146/96 | HR 82 | Temp 98.4°F | Wt 163.9 lb

## 2020-05-23 DIAGNOSIS — E739 Lactose intolerance, unspecified: Secondary | ICD-10-CM

## 2020-05-23 DIAGNOSIS — L918 Other hypertrophic disorders of the skin: Secondary | ICD-10-CM

## 2020-05-23 DIAGNOSIS — Z23 Encounter for immunization: Secondary | ICD-10-CM | POA: Diagnosis not present

## 2020-05-23 NOTE — Progress Notes (Signed)
Kirsten Herrera is a 44 y.o. female who presents to  [place] at [facility]  today, 05/23/20, seeking care for the following:   Follow-up GI issues  Last visit 04/25/2020: Irritable bowel syndrome with both constipation and diarrhea --> treat as IBS, trial low FODMAP, ok to conintue Bentyl and Imodium prior to meals, add fiber. Also advised consult w/ psychiatrist re: association between anxiety/medications and GI symptoms.  Today 05/23/20 notes significant improvement with reduction/elimination of lactose and certain nuts   New today: skin tags x3 on L torso, irritating, requests removal.      ASSESSMENT & PLAN with other pertinent findings:  The primary encounter diagnosis was Skin tag. Diagnoses of Need for influenza vaccination and Lactose intolerance were also pertinent to this visit.   1. Need for influenza vaccination done  2. Skin tag Skin anesthetized at bases of skin tags, removed w/ dermablade. Largest of the 3 about 0.5 cm diameter had some more bleeding and had vein in close proximity so was sent for pathology and 3 simple interrupted sutures placed for closure. Blood loss minimal.   3. Lactose intolerance Stable   No results found for this or any previous visit (from the past 24 hour(s)).     There are no Patient Instructions on file for this visit.  Orders Placed This Encounter  Procedures  . Flu Vaccine QUAD 6+ mos PF IM (Fluarix Quad PF)    No orders of the defined types were placed in this encounter.      Follow-up instructions: Return in about 1 week (around 05/30/2020) for suture removal .                                         BP (!) 146/96 (BP Location: Left Arm, Patient Position: Sitting, Cuff Size: Small)   Pulse 82   Temp 98.4 F (36.9 C)   Wt 163 lb 14.4 oz (74.3 kg)   SpO2 100%   BMI 25.67 kg/m   Current Meds  Medication Sig  . ALPRAZolam  (XANAX) 0.5 MG tablet Take 0.5 mg by mouth 2 (two) times daily as needed.  Marland Kitchen amitriptyline (ELAVIL) 10 MG tablet   . dicyclomine (BENTYL) 20 MG tablet Take 20 mg by mouth 2 (two) times daily.  Marland Kitchen lamoTRIgine (LAMICTAL) 200 MG tablet Take 200 mg by mouth daily.  . sucralfate (CARAFATE) 1 g tablet Take 1 g by mouth 4 (four) times daily.  . temazepam (RESTORIL) 15 MG capsule Take 15-30 mg by mouth at bedtime as needed.    No results found for this or any previous visit (from the past 72 hour(s)).  No results found.     All questions at time of visit were answered - patient instructed to contact office with any additional concerns or updates.  ER/RTC precautions were reviewed with the patient as applicable.   Please note: voice recognition software was used to produce this document, and typos may escape review. Please contact Dr. Sheppard Coil for any needed clarifications.

## 2020-05-31 ENCOUNTER — Encounter: Payer: Self-pay | Admitting: Osteopathic Medicine

## 2020-05-31 ENCOUNTER — Ambulatory Visit (INDEPENDENT_AMBULATORY_CARE_PROVIDER_SITE_OTHER): Admitting: Osteopathic Medicine

## 2020-05-31 ENCOUNTER — Other Ambulatory Visit: Payer: Self-pay

## 2020-05-31 VITALS — BP 134/88 | HR 82 | Temp 98.2°F | Wt 159.0 lb

## 2020-05-31 DIAGNOSIS — D3610 Benign neoplasm of peripheral nerves and autonomic nervous system, unspecified: Secondary | ICD-10-CM

## 2020-05-31 NOTE — Progress Notes (Signed)
Suture removal - 3 simple interrupted sutures removed w/o difficulty, wound healing well, no concerns from patient.  Discussed path report - benign

## 2020-10-20 ENCOUNTER — Emergency Department (INDEPENDENT_AMBULATORY_CARE_PROVIDER_SITE_OTHER)

## 2020-10-20 ENCOUNTER — Encounter: Payer: Self-pay | Admitting: Emergency Medicine

## 2020-10-20 ENCOUNTER — Emergency Department (INDEPENDENT_AMBULATORY_CARE_PROVIDER_SITE_OTHER)
Admission: EM | Admit: 2020-10-20 | Discharge: 2020-10-20 | Disposition: A | Discharge status: Home / Self Care | Source: Home / Self Care | Attending: Family Medicine | Admitting: Family Medicine

## 2020-10-20 ENCOUNTER — Other Ambulatory Visit: Payer: Self-pay

## 2020-10-20 DIAGNOSIS — Z8616 Personal history of COVID-19: Secondary | ICD-10-CM

## 2020-10-20 DIAGNOSIS — R0989 Other specified symptoms and signs involving the circulatory and respiratory systems: Secondary | ICD-10-CM

## 2020-10-20 DIAGNOSIS — U099 Post covid-19 condition, unspecified: Secondary | ICD-10-CM

## 2020-10-20 DIAGNOSIS — R059 Cough, unspecified: Secondary | ICD-10-CM | POA: Diagnosis not present

## 2020-10-20 DIAGNOSIS — R6883 Chills (without fever): Secondary | ICD-10-CM | POA: Diagnosis not present

## 2020-10-20 DIAGNOSIS — R5383 Other fatigue: Secondary | ICD-10-CM | POA: Diagnosis not present

## 2020-10-20 DIAGNOSIS — R0981 Nasal congestion: Secondary | ICD-10-CM

## 2020-10-20 MED ORDER — PREDNISONE 20 MG PO TABS: ORAL_TABLET | ORAL | 0 refills | Status: DC

## 2020-10-20 NOTE — Discharge Instructions (Addendum)
Post Covid care center ([facility]) [phone]  Rest.  Push fluids.  Take prednisone as directed

## 2020-10-20 NOTE — ED Triage Notes (Addendum)
Sinus congestion & fatigue x 3 days  Has been sick on and off since having COVID in the middle of January 2022 COVID booster 08/2020 COVID 08/13/2019 Using flonase & alka seltzer OTC  [place] as a teen

## 2020-10-20 NOTE — ED Provider Notes (Signed)
Kirsten Herrera CARE    CSN: [id number] Arrival date & time: 10/20/20  1229      History   Chief Complaint Chief Complaint  Patient presents with  . Facial Pain  . Cough    HPI Kirsten Herrera is a 45 y.o. female.   HPI   Pleasant 45 year old woman.  Generally in good health.  History of asthma but none for a long time. Patient states that she had COVID in January 2021.  She was very sick.  After this she did have Covid vaccinations and booster.  She developed Covid again in January 2022.  She has not felt well since.  She continues to have spells of fatigue.  Shortness of breath.  Sweats at night.  Currently has recurrence of some sinus congestion runny nose as well as the fatigue.  Headaches and body aches that are intermittent and mild. With a deep breath chest pain underneath right breast.  No shortness of breath.  Cough intermittently feels like her immunity that her health is poor since her second Covid  Past Medical History:  Diagnosis Date  . Anxiety   . Asthma   . COVID-19 08/13/2019   also 08/2020  . Seasonal allergies     Patient Active Problem List   Diagnosis Date Noted  . Abnormal mammogram of left breast 09/03/2018  . Recurrent pansinusitis 01/02/2017  . Right foot pain 06/12/2016  . Vestibular dizziness involving right inner ear 05/16/2016  . Hypovitaminosis D 04/21/2015  . Chronic fatigue 04/21/2015  . Globus pharyngeus 04/12/2015  . Dysphagia, pharyngoesophageal phase 03/16/2015  . Depression 03/16/2015  . Seasonal allergies 03/16/2015  . Low back pain 04/05/2014    Past Surgical History:  Procedure Laterality Date  . ABDOMINAL SURGERY     c-section x 2   . CESAREAN SECTION    . TUBAL LIGATION    . tuval ligation      OB History   No obstetric history on file.      Home Medications    Prior to Admission medications   Medication Sig Start Date End Date Taking? Authorizing Provider  ALPRAZolam Duanne Moron) 0.5 MG tablet Take 0.5 mg by  mouth 2 (two) times daily as needed. 03/07/20  Yes [provider]  amitriptyline (ELAVIL) 10 MG tablet  05/15/17  Yes [provider]  lamoTRIgine (LAMICTAL) 200 MG tablet Take 200 mg by mouth daily. 05/10/20  Yes [provider]  predniSONE (DELTASONE) 20 MG tablet Take 1 pill 2 times a day for 5 days then 1 pill a day until gone 10/20/20  Yes Raylene Everts, MD  temazepam (RESTORIL) 15 MG capsule Take 15-30 mg by mouth at bedtime as needed. 03/09/20  Yes [provider]  dicyclomine (BENTYL) 20 MG tablet Take 20 mg by mouth 2 (two) times daily. Patient not taking: Reported on 05/31/2020 04/28/20   [provider]  sucralfate (CARAFATE) 1 g tablet Take 1 g by mouth 4 (four) times daily. Patient not taking: Reported on 05/31/2020 03/29/20   [provider]    Family History Family History  Problem Relation Age of Onset  . Parkinson's disease Father        Grandmother said Dad had [employer] White Disease   . Cancer Other        throat  . Diabetes Other   . Stroke Other     Social History Social History   Tobacco Use  . Smoking status: Former Smoker  Quit date: 08/17/2005    Years since quitting: 15.1  . Smokeless tobacco: Never Used  Vaping Use  . Vaping Use: Never used  Substance Use Topics  . Alcohol use: Yes    Alcohol/week: 0.0 standard drinks  . Drug use: No     Allergies   Patient has no known allergies.   Review of Systems Review of Systems See HPI  Physical Exam Triage Vital Signs ED Triage Vitals  Enc Vitals Group     BP 10/20/20 1259 (!) 131/96     Pulse Rate 10/20/20 1259 80     Resp 10/20/20 1259 16     Temp 10/20/20 1259 98.6 F (37 C)     Temp Source 10/20/20 1259 Oral     SpO2 10/20/20 1259 99 %     Weight 10/20/20 1304 160 lb (72.6 kg)     Height 10/20/20 1304 5\' 7"  (1.702 m)     Head Circumference --      Peak Flow --      Pain Score 10/20/20 1303 4     Pain Loc --      Pain Edu?  --      Excl. in [place]? --    No data found.  Updated Vital Signs BP (!) 131/96 (BP Location: Right Arm)   Pulse 80   Temp 98.6 F (37 C) (Oral)   Resp 16   Ht 5\' 7"  (1.702 m)   Wt 72.6 kg   LMP 10/17/2020 (Exact Date)   SpO2 99%   BMI 25.06 kg/m      Physical Exam Constitutional:      General: She is not in acute distress.    Appearance: She is well-developed and normal weight.  HENT:     Head: Normocephalic and atraumatic.     Right Ear: Tympanic membrane and ear canal normal.     Left Ear: Tympanic membrane and ear canal normal.     Nose: Nose normal.     Mouth/Throat:     Mouth: Mucous membranes are moist.     Pharynx: No posterior oropharyngeal erythema.  Eyes:     Conjunctiva/sclera: Conjunctivae normal.     Pupils: Pupils are equal, round, and reactive to light.  Cardiovascular:     Rate and Rhythm: Normal rate and regular rhythm.     Heart sounds: Normal heart sounds.  Pulmonary:     Effort: Pulmonary effort is normal. No respiratory distress.     Breath sounds: Rales present.     Comments: Right anterior the under right breast Abdominal:     General: There is no distension.     Palpations: Abdomen is soft.  Musculoskeletal:        General: Normal range of motion.     Cervical back: Normal range of motion.  Lymphadenopathy:     Cervical: No cervical adenopathy.  Skin:    General: Skin is warm and dry.  Neurological:     General: No focal deficit present.     Mental Status: She is alert.  Psychiatric:        Behavior: Behavior normal.      UC Treatments / Results  Labs (all labs ordered are listed, but only abnormal results are displayed) Labs Reviewed - No data to display  EKG   Radiology DG Chest 2 View  Result Date: 10/20/2020 CLINICAL DATA:  Provided history: Cough, right rales. Additional history provided: Patient reports cough for 4 days, fatigue, chills, no fever, sinus congestion,  COVID x 2 (most recently January 2022). EXAM: CHEST -  2 VIEW COMPARISON:  Radiographs of the left ribs and chest 04/22/2019. FINDINGS: Heart size within normal limits. No appreciable airspace consolidation. No evidence of pleural effusion or pneumothorax. No acute bony abnormality identified. IMPRESSION: No evidence of active cardiopulmonary disease. Electronically Signed   By: Kellie Simmering DO   On: 10/20/2020 14:48    Procedures Procedures (including critical care time)  Medications Ordered in UC Medications - No data to display  Initial Impression / Assessment and Plan / UC Course  I have reviewed the triage vital signs and the nursing notes.  Pertinent labs & imaging results that were available during my care of the patient were reviewed by me and considered in my medical decision making (see chart for details).    No pneumonia identified.  Patient states she feels better with prednisone.  We will give her a course.  She will follow up with the long Covid clinic if she still has problems Final Clinical Impressions(s) / UC Diagnoses   Final diagnoses:  Cough  Long COVID     Discharge Instructions     Post Covid care center ([facility]) [phone]  Rest.  Push fluids.  Take prednisone as directed    ED Prescriptions    Medication Sig Dispense Auth. Provider   predniSONE (DELTASONE) 20 MG tablet Take 1 pill 2 times a day for 5 days then 1 pill a day until gone 15 tablet Meda Coffee Jennette Banker, MD     PDMP not reviewed this encounter.   Raylene Everts, MD 10/20/20 [phone]

## 2021-09-25 ENCOUNTER — Other Ambulatory Visit: Payer: Self-pay

## 2021-09-25 ENCOUNTER — Ambulatory Visit (INDEPENDENT_AMBULATORY_CARE_PROVIDER_SITE_OTHER): Admitting: Sports Medicine

## 2021-09-25 VITALS — BP 131/90 | HR 83 | Temp 98.0°F | Wt 150.0 lb

## 2021-09-25 DIAGNOSIS — G8929 Other chronic pain: Secondary | ICD-10-CM

## 2021-09-25 DIAGNOSIS — R519 Headache, unspecified: Secondary | ICD-10-CM

## 2021-09-25 MED ORDER — PREDNISONE 50 MG PO TABS: ORAL_TABLET | ORAL | 0 refills | Status: DC

## 2021-09-25 MED ORDER — KETOROLAC TROMETHAMINE 60 MG/2ML IM SOLN
30.0000 mg | Freq: Once | INTRAMUSCULAR | Status: AC
  Administered 2021-09-25: 30 mg via INTRAMUSCULAR

## 2021-09-25 MED ORDER — AMOXICILLIN-POT CLAVULANATE 875-125 MG PO TABS: 1.0000 | ORAL_TABLET | Freq: Two times a day (BID) | ORAL | 0 refills | Status: AC

## 2021-09-25 MED ORDER — FLUCONAZOLE 150 MG PO TABS
150.0000 mg | ORAL_TABLET | Freq: Once | ORAL | 3 refills | Status: AC
Start: 2021-09-25 — End: 2021-09-25

## 2021-09-25 MED ORDER — RIZATRIPTAN BENZOATE 10 MG PO TBDP: 10.0000 mg | ORAL_TABLET | ORAL | 3 refills | Status: DC | PRN

## 2021-09-25 NOTE — Assessment & Plan Note (Signed)
This is a pleasant 46 year old female, history of migraines, for the past month she has had a headache, bitemporal, dull and throbbing associated with mild photophobia and nausea. She also has some pain and pressure over her maxillary sinuses. No significant viral symptomatology, though she has not done a COVID test. I have encouraged her to do 1 at home. Her exam is unrevealing with the exception of the tenderness over the maxillary sinuses. Oropharyngeal and nasal exams are unrevealing. No focal neurologic symptoms with the exception of mild intermittent diplopia. Considering duration and chronicity of headaches and the lack of historical cross-sectional/advanced imaging we will proceed with MRI of the brain with and without contrast. To treat her current headache which does have components that sound to be both migrainous and sinusitis we will treat with 5 days of steroids, Zofran for nausea, Augmentin with Diflucan, as well as Maxalt to use at home. Return to see me in approximately 2 to 3 weeks to see how things are going.

## 2021-09-25 NOTE — Addendum Note (Signed)
Addended by: Gust Brooms on: 09/25/2021 04:27 PM   Modules accepted: Orders

## 2021-09-25 NOTE — Progress Notes (Signed)
° ° °  Procedures performed today:    None.  Independent interpretation of notes and tests performed by another provider:   None.  Brief History, Exam, Impression, and Recommendations:    Headache This is a pleasant 46 year old female, history of migraines, for the past month she has had a headache, bitemporal, dull and throbbing associated with mild photophobia and nausea. She also has some pain and pressure over her maxillary sinuses. No significant viral symptomatology, though she has not done a COVID test. I have encouraged her to do 1 at home. Her exam is unrevealing with the exception of the tenderness over the maxillary sinuses. Oropharyngeal and nasal exams are unrevealing. No focal neurologic symptoms with the exception of mild intermittent diplopia. Considering duration and chronicity of headaches and the lack of historical cross-sectional/advanced imaging we will proceed with MRI of the brain with and without contrast. To treat her current headache which does have components that sound to be both migrainous and sinusitis we will treat with 5 days of steroids, Zofran for nausea, Augmentin with Diflucan, as well as Maxalt to use at home. Return to see me in approximately 2 to 3 weeks to see how things are going.    ___________________________________________ Gwen Her. Dianah Field, M.D., ABFM., CAQSM. Primary Care and [place] Instructor of [place] [employer] [facility] [employer]

## 2021-09-26 ENCOUNTER — Telehealth: Payer: Self-pay

## 2021-09-26 ENCOUNTER — Other Ambulatory Visit: Payer: Self-pay

## 2021-09-26 DIAGNOSIS — G8929 Other chronic pain: Secondary | ICD-10-CM

## 2021-09-26 DIAGNOSIS — R519 Headache, unspecified: Secondary | ICD-10-CM

## 2021-09-26 MED ORDER — ONDANSETRON 8 MG PO TBDP: 8.0000 mg | ORAL_TABLET | Freq: Three times a day (TID) | ORAL | 3 refills | Status: DC | PRN

## 2021-09-26 NOTE — Telephone Encounter (Signed)
Patient has potential Jury duty on Monday. She is scheduled for the brain mri on Tuesday morning. She is requesting a note to cover her if she has to go through Tuesday. She prefers to not reschedule the mri.   Please advise

## 2021-09-27 ENCOUNTER — Encounter: Payer: Self-pay | Admitting: Sports Medicine

## 2021-09-27 NOTE — Telephone Encounter (Signed)
Letter written, I also sent her a text to sign up for MyChart so that she can download the letter from her phone or computer.

## 2021-09-27 NOTE — Telephone Encounter (Signed)
Called patient and confirmed receipt of letter via [place]. Patient received letter.

## 2021-09-29 ENCOUNTER — Telehealth: Payer: Self-pay

## 2021-09-29 MED ORDER — TRAMADOL HCL 50 MG PO TABS: 50.0000 mg | ORAL_TABLET | Freq: Three times a day (TID) | ORAL | 0 refills | Status: DC | PRN

## 2021-09-29 NOTE — Telephone Encounter (Signed)
Patient called to report that her ear seems to be getting worse. It is very painful. She is requesting some drops to help with the pain.

## 2021-09-29 NOTE — Telephone Encounter (Signed)
The ear exam was unremarkable so the canal itself is not a problem, thus an eardrop will not help, I will call in some tramadol.  Still awaiting MRI.

## 2021-09-29 NOTE — Telephone Encounter (Signed)
Patient aware of recommendations and is scheduled for MRI on Tuesday at 8am.

## 2021-10-02 ENCOUNTER — Other Ambulatory Visit: Payer: Self-pay | Admitting: Sports Medicine

## 2021-10-02 MED ORDER — TRIAZOLAM 0.25 MG PO TABS: ORAL_TABLET | ORAL | 0 refills | Status: DC

## 2021-10-03 ENCOUNTER — Ambulatory Visit (INDEPENDENT_AMBULATORY_CARE_PROVIDER_SITE_OTHER)

## 2021-10-03 ENCOUNTER — Other Ambulatory Visit: Payer: Self-pay

## 2021-10-03 ENCOUNTER — Encounter: Payer: Self-pay | Admitting: Sports Medicine

## 2021-10-03 DIAGNOSIS — R519 Headache, unspecified: Secondary | ICD-10-CM | POA: Diagnosis not present

## 2021-10-03 DIAGNOSIS — G8929 Other chronic pain: Secondary | ICD-10-CM | POA: Diagnosis not present

## 2021-10-03 MED ORDER — GADOBUTROL 1 MMOL/ML IV SOLN
7.0000 mL | Freq: Once | INTRAVENOUS | Status: AC | PRN
  Administered 2021-10-03: 7 mL via INTRAVENOUS

## 2021-10-04 ENCOUNTER — Encounter: Payer: Self-pay | Admitting: Sports Medicine

## 2021-10-04 DIAGNOSIS — R519 Headache, unspecified: Secondary | ICD-10-CM

## 2021-10-04 DIAGNOSIS — G8929 Other chronic pain: Secondary | ICD-10-CM

## 2021-10-04 MED ORDER — IPRATROPIUM BROMIDE 0.06 % NA SOLN: 2.0000 | Freq: Four times a day (QID) | NASAL | 11 refills | Status: DC

## 2021-10-04 MED ORDER — THERAFLU EXPRESSMAX SEV CLD/FL 5-10-200-325 MG/15ML PO LIQD: ORAL | 0 refills | Status: DC

## 2021-10-09 ENCOUNTER — Other Ambulatory Visit: Payer: Self-pay

## 2021-10-09 ENCOUNTER — Ambulatory Visit (INDEPENDENT_AMBULATORY_CARE_PROVIDER_SITE_OTHER): Admitting: Sports Medicine

## 2021-10-09 DIAGNOSIS — R519 Headache, unspecified: Secondary | ICD-10-CM

## 2021-10-09 NOTE — Progress Notes (Signed)
? ? ?  Procedures performed today:   ? ?None. ? ?Independent interpretation of notes and tests performed by another provider:  ? ?None. ? ?Brief History, Exam, Impression, and Recommendations:   ? ?Sinus headache ?This is a pleasant 46 year old female, she has a history of migraines, however further a month prior to her last visit she had had a headache, bitemporal, dull and throbbing, she did have mild photophobia and phonophobia but she also endorsed pain over the maxillary sinuses, right significantly worse than left. ?Did not have any other significant viral symptomatology, she did have tenderness on exam over the maxillary sinuses and erythematous and boggy turbinates at the time. ?Ear exam was unrevealing. ?Considering the duration and chronicity of her headaches, and the lack of prior cross-sectional imaging we proceeded with MRI of the brain with and without contrast, intracranial contents were normal, she did have asymmetry in the size of her maxillary sinuses, right smaller than left, she also had some right-sided maxillary sinus wall thickening as well as ethmoidal cell wall thickening.  In addition she had a significantly deviated septum to the right. ?We had initially treated her with steroids and Augmentin, she did not have significant improvement until we added nasal Atrovent, she will says that this really open things up, and her pain improved drastically. ?Considering the severity of her presentation, as well as the deviation of her septum she is requesting referral to ENT, she has worked with Dr. Vevelyn Royals at [facility] in the past, I think we should get her back in with her for discussion of sinuplasty. ? ? ? ?___________________________________________ ?Gwen Her. Dianah Field, M.D., ABFM., CAQSM. ?Primary Care and Sports Medicine ?[place] ? ?Adjunct Instructor of Family Medicine  ?[employer] ?

## 2021-10-09 NOTE — Assessment & Plan Note (Signed)
This is a pleasant 47 year old female, she has a history of migraines, however further a month prior to her last visit she had had a headache, bitemporal, dull and throbbing, she did have mild photophobia and phonophobia but she also endorsed pain over the maxillary sinuses, right significantly worse than left. ?Did not have any other significant viral symptomatology, she did have tenderness on exam over the maxillary sinuses and erythematous and boggy turbinates at the time. ?Ear exam was unrevealing. ?Considering the duration and chronicity of her headaches, and the lack of prior cross-sectional imaging we proceeded with MRI of the brain with and without contrast, intracranial contents were normal, she did have asymmetry in the size of her maxillary sinuses, right smaller than left, she also had some right-sided maxillary sinus wall thickening as well as ethmoidal cell wall thickening.  In addition she had a significantly deviated septum to the right. ?We had initially treated her with steroids and Augmentin, she did not have significant improvement until we added nasal Atrovent, she will says that this really open things up, and her pain improved drastically. ?Considering the severity of her presentation, as well as the deviation of her septum she is requesting referral to ENT, she has worked with Dr. Vevelyn Royals at [facility] in the past, I think we should get her back in with her for discussion of sinuplasty. ?

## 2021-12-01 ENCOUNTER — Ambulatory Visit: Admitting: Medical-Surgical

## 2021-12-07 ENCOUNTER — Ambulatory Visit (INDEPENDENT_AMBULATORY_CARE_PROVIDER_SITE_OTHER): Admitting: Medical-Surgical

## 2021-12-07 ENCOUNTER — Encounter: Payer: Self-pay | Admitting: Medical-Surgical

## 2021-12-07 VITALS — BP 124/84 | HR 67 | Resp 20 | Ht 67.0 in | Wt 149.8 lb

## 2021-12-07 DIAGNOSIS — Z7689 Persons encountering health services in other specified circumstances: Secondary | ICD-10-CM | POA: Diagnosis not present

## 2021-12-07 DIAGNOSIS — G8929 Other chronic pain: Secondary | ICD-10-CM

## 2021-12-07 DIAGNOSIS — R519 Headache, unspecified: Secondary | ICD-10-CM

## 2021-12-07 DIAGNOSIS — Z1211 Encounter for screening for malignant neoplasm of colon: Secondary | ICD-10-CM

## 2021-12-07 MED ORDER — FLUTICASONE PROPIONATE 50 MCG/ACT NA SUSP: 2.0000 | Freq: Every day | NASAL | 6 refills | Status: DC

## 2021-12-07 MED ORDER — METHYLPREDNISOLONE ACETATE 80 MG/ML IJ SUSP
80.0000 mg | Freq: Once | INTRAMUSCULAR | Status: AC
  Administered 2021-12-07: 80 mg via INTRAMUSCULAR

## 2021-12-07 NOTE — Progress Notes (Signed)
?  HPI with pertinent ROS:  ? ?CC: Transfer of care ? ?HPI: ?Pleasant 46 year old female presenting today to transfer care to new PCP and for the following: ? ?Mood- managed by psychiatry and counseling.  Notes recent med changes.  Feels that she is having some depressive symptoms but is closely monitored. ? ?Headache- Has been having severe facial pain and sinus pressure since January.  She has been evaluated by Dr. Dianah Field and had an MRI which showed significant right septal deviation as well as a small right maxillary sinus and some mucosal thickening.  She was referred to ENT and has attended the appointment.  Notes that she did not get any resolution or helpful information at that appointment and she would like to see a different ENT, preferably female.  Has tried using Maxalt for her headaches but this was unhelpful she was treated with prednisone and Augmentin back in February but this was also only minimally helpful.  She got some relief with the use of Atrovent temporarily but is not currently using this.  She has not been trying over-the-counter antihistamines or Flonase but is open to the options. ? ?I reviewed the past medical history, family history, social history, surgical history, and allergies today and no changes were needed.  Please see the problem list section below in epic for further details. ? ?Physical exam:  ? ?General: Well Developed, well nourished, and in no acute distress.  ?Neuro: Alert and oriented x3.  ?HEENT: Normocephalic, atraumatic.  Significant frontal and maxillary sinus tenderness.  No cervical lymphadenopathy.  Bilateral external ear canals patent, TMs notable for moderate bulging with serous effusions bilaterally. ?Skin: Warm and dry. ?Cardiac: Regular rate and rhythm, no murmurs rubs or gallops, no lower extremity edema.  ?Respiratory: Clear to auscultation bilaterally. Not using accessory muscles, speaking in full sentences. ? ?Impression and Recommendations:   ? ?1.  Encounter to establish care ?Reviewed available information and discussed care concerns with patient.  ? ?2. Chronic intractable headache, unspecified headache type ?3. Sinus headache ?Referring to ENT, female provider for second opinion.  Depo-Medrol 80 mg given in office today.  Recommend starting Flonase and reviewed appropriate administration technique for the best results.  Recommend she do this twice daily for the next 3 to 5 days and then reduce to once daily dosing.  Okay to use Atrovent if desired.  Discussed possible treatment with oral prednisone versus antibiotics and she would like to hold off as she feels that this is more of a Band-Aid over what is going on rather than addressing the cause. ?- Ambulatory referral to ENT ?- methylPREDNISolone acetate (DEPO-MEDROL) injection 80 mg ? ?4. Colon cancer screening ?Referring to GI for colon cancer screening. ?- Ambulatory referral to Gastroenterology ? ?Return if symptoms worsen or fail to improve. ?___________________________________________ ?Clearnce Sorrel, DNP, APRN, FNP-BC ?Primary Care and Sports Medicine ?[place] ?

## 2021-12-22 ENCOUNTER — Ambulatory Visit (AMBULATORY_SURGERY_CENTER): Payer: Self-pay | Admitting: *Deleted

## 2021-12-22 VITALS — Ht 67.0 in | Wt 149.7 lb

## 2021-12-22 DIAGNOSIS — Z1211 Encounter for screening for malignant neoplasm of colon: Secondary | ICD-10-CM

## 2021-12-22 MED ORDER — NA SULFATE-K SULFATE-MG SULF 17.5-3.13-1.6 GM/177ML PO SOLN: 1.0000 | Freq: Once | ORAL | 0 refills | Status: AC

## 2021-12-22 NOTE — Progress Notes (Signed)

## 2022-01-10 ENCOUNTER — Ambulatory Visit (AMBULATORY_SURGERY_CENTER): Admitting: Gastroenterology

## 2022-01-10 ENCOUNTER — Encounter: Payer: Self-pay | Admitting: Gastroenterology

## 2022-01-10 VITALS — BP 119/83 | HR 67 | Temp 96.2°F | Resp 18 | Ht 67.0 in | Wt 149.7 lb

## 2022-01-10 DIAGNOSIS — Z1211 Encounter for screening for malignant neoplasm of colon: Secondary | ICD-10-CM

## 2022-01-10 DIAGNOSIS — K635 Polyp of colon: Secondary | ICD-10-CM

## 2022-01-10 DIAGNOSIS — D123 Benign neoplasm of transverse colon: Secondary | ICD-10-CM

## 2022-01-10 MED ORDER — SODIUM CHLORIDE 0.9 % IV SOLN: 500.0000 mL | Freq: Once | INTRAVENOUS | Status: DC

## 2022-01-10 NOTE — Progress Notes (Signed)
Referring Provider: Samuel Bouche, NP Primary Care Physician:  Samuel Bouche, NP  Indication for Procedure:  Colon cancer screening   IMPRESSION:  Need for colon cancer screening Appropriate candidate for monitored anesthesia care  PLAN: Colonoscopy in the [place] today   HPI: Kirsten Herrera is a 46 y.o. female presents for screening colonoscopy.  No prior colonoscopy or colon cancer screening.  No baseline GI symptoms.   No known family history of colon cancer or polyps. No family history of uterine/endometrial cancer, pancreatic cancer or gastric/stomach cancer.   Past Medical History:  Diagnosis Date   Allergy    seasonal   Anxiety    Asthma    as achild   COVID-19 08/13/2019   also 08/2020   Depression    Seasonal allergies     Past Surgical History:  Procedure Laterality Date   ABDOMINAL SURGERY     c-section x 2    CESAREAN SECTION     TUBAL LIGATION     tuval ligation      Current Outpatient Medications  Medication Sig Dispense Refill   amitriptyline (ELAVIL) 10 MG tablet 50 mg at bedtime.     amphetamine-dextroamphetamine (ADDERALL) 10 MG tablet Take 10 mg by mouth daily.     eszopiclone (LUNESTA) 1 MG TABS tablet Take 2 mg by mouth at bedtime.     lamoTRIgine (LAMICTAL) 200 MG tablet Take 200 mg by mouth 2 (two) times daily.     lisdexamfetamine (VYVANSE) 50 MG capsule Take 50 mg by mouth daily.     sertraline (ZOLOFT) 50 MG tablet Take 50 mg by mouth every morning.     ALPRAZolam (XANAX) 0.5 MG tablet Take 0.5 mg by mouth 2 (two) times daily as needed.     fluticasone (FLONASE) 50 MCG/ACT nasal spray Place 2 sprays into both nostrils daily. (Patient taking differently: Place 2 sprays into both nostrils as needed.) 16 g 6   ipratropium (ATROVENT) 0.06 % nasal spray Place 2 sprays into both nostrils 4 (four) times daily. (Patient not taking: Reported on 01/10/2022) 15 mL 11   rizatriptan (MAXALT-MLT) 10 MG disintegrating tablet Take 1 tablet (10 mg total) by  mouth as needed for migraine. May repeat in 2 hours if needed 10 tablet 3   Current Facility-Administered Medications  Medication Dose Route Frequency Provider Last Rate Last Admin   0.9 %  sodium chloride infusion  500 mL Intravenous Once Thornton Park, MD        Allergies as of 01/10/2022   (No Known Allergies)    Family History  Problem Relation Age of Onset   Colon polyps Mother    Parkinson's disease Father        Grandmother said Dad had Wolf Parkinson White Disease    Cancer Other        throat   Diabetes Other    Stroke Other    Colon cancer Neg Hx    Crohn's disease Neg Hx    Esophageal cancer Neg Hx    Rectal cancer Neg Hx    Stomach cancer Neg Hx      Physical Exam: General:   Alert,  well-nourished, pleasant and cooperative in NAD Head:  Normocephalic and atraumatic. Eyes:  Sclera clear, no icterus.   Conjunctiva pink. Mouth:  No deformity or lesions.   Neck:  Supple; no masses or thyromegaly. Lungs:  Clear throughout to auscultation.   No wheezes. Heart:  Regular rate and rhythm; no murmurs. Abdomen:  Soft, non-tender,  nondistended, normal bowel sounds, no rebound or guarding.  Msk:  Symmetrical. No boney deformities LAD: No inguinal or umbilical LAD Extremities:  No clubbing or edema. Neurologic:  Alert and  oriented x4;  grossly nonfocal Skin:  No obvious rash or bruise. Psych:  Alert and cooperative. Normal mood and affect.     Studies/Results: No results found.    Mirayah Wren L. Tarri Glenn, MD, MPH 01/10/2022, 11:19 AM

## 2022-01-10 NOTE — Progress Notes (Signed)
Pt's states no medical or surgical changes since previsit or office visit. 

## 2022-01-10 NOTE — Op Note (Signed)
[facility] Patient Name: Kirsten Herrera Procedure Date: 01/10/2022 11:19 AM MRN: [id number] Endoscopist: Thornton Park MD, MD Age: 46 Referring MD:  Date of Birth: 16-Aug-1975 Gender: Female Account #: [id number] Procedure:                Colonoscopy Indications:              Screening for colorectal malignant neoplasm, This                            is the patient's first colonoscopy Medicines:                Monitored Anesthesia Care Procedure:                Pre-Anesthesia Assessment:                           - Prior to the procedure, a History and Physical                            was performed, and patient medications and                            allergies were reviewed. The patient's tolerance of                            previous anesthesia was also reviewed. The risks                            and benefits of the procedure and the sedation                            options and risks were discussed with the patient.                            All questions were answered, and informed consent                            was obtained. Prior Anticoagulants: The patient has                            taken no previous anticoagulant or antiplatelet                            agents. ASA Grade Assessment: II - A patient with                            mild systemic disease. After reviewing the risks                            and benefits, the patient was deemed in                            satisfactory condition to undergo the procedure.  After obtaining informed consent, the colonoscope                            was passed under direct vision. Throughout the                            procedure, the patient's blood pressure, pulse, and                            oxygen saturations were monitored continuously. The                            Olympus CF-HQ190L [id number]) Colonoscope was                            introduced through the  anus and advanced to the 3                            cm into the ileum. The colonoscopy was performed                            without difficulty. The patient tolerated the                            procedure well. The quality of the bowel                            preparation was excellent. The terminal ileum,                            ileocecal valve, appendiceal orifice, and rectum                            were photographed. Scope In: 11:26:50 AM Scope Out: 11:41:08 AM Scope Withdrawal Time: 0 hours 10 minutes 8 seconds  Total Procedure Duration: 0 hours 14 minutes 18 seconds  Findings:                 The perianal and digital rectal examinations were                            normal.                           A 1 mm polyp was found in the hepatic flexure. The                            polyp was sessile. The polyp was removed with a                            cold snare. Resection and retrieval were complete.                            Estimated blood loss was minimal.  The exam was otherwise without abnormality on                            direct and retroflexion views. Complications:            No immediate complications. Estimated Blood Loss:     Estimated blood loss was minimal. Impression:               - One 1 mm polyp at the hepatic flexure, removed                            with a cold snare. Resected and retrieved.                           - The examination was otherwise normal on direct                            and retroflexion views. Recommendation:           - Patient has a contact number available for                            emergencies. The signs and symptoms of potential                            delayed complications were discussed with the                            patient. Return to normal activities tomorrow.                            Written discharge instructions were provided to the                             patient.                           - Resume previous diet.                           - Continue present medications.                           - Await pathology results.                           - Repeat colonoscopy date to be determined after                            pending pathology results are reviewed for                            surveillance.                           - Emerging evidence supports eating a diet of  fruits, vegetables, grains, calcium, and yogurt                            while reducing red meat and alcohol may reduce the                            risk of colon cancer.                           - Thank you for allowing me to be involved in your                            colon cancer prevention. Thornton Park MD, MD 01/10/2022 11:44:39 AM This report has been signed electronically.

## 2022-01-10 NOTE — Progress Notes (Signed)
PT taken to PACU. Monitors in place. VSS. Report given to RN. 

## 2022-01-10 NOTE — Patient Instructions (Signed)
Please read handouts provided. Continue present medications. Await pathology results.   YOU HAD AN ENDOSCOPIC PROCEDURE TODAY AT THE [facility]:   Refer to the procedure report that was given to you for any specific questions about what was found during the examination.  If the procedure report does not answer your questions, please call your gastroenterologist to clarify.  If you requested that your care partner not be given the details of your procedure findings, then the procedure report has been included in a sealed envelope for you to review at your convenience later.  YOU SHOULD EXPECT: Some feelings of bloating in the abdomen. Passage of more gas than usual.  Walking can help get rid of the air that was put into your GI tract during the procedure and reduce the bloating. If you had a lower endoscopy (such as a colonoscopy or flexible sigmoidoscopy) you may notice spotting of blood in your stool or on the toilet paper. If you underwent a bowel prep for your procedure, you may not have a normal bowel movement for a few days.  Please Note:  You might notice some irritation and congestion in your nose or some drainage.  This is from the oxygen used during your procedure.  There is no need for concern and it should clear up in a day or so.  SYMPTOMS TO REPORT IMMEDIATELY:  Following lower endoscopy (colonoscopy or flexible sigmoidoscopy):  Excessive amounts of blood in the stool  Significant tenderness or worsening of abdominal pains  Swelling of the abdomen that is new, acute  Fever of 100F or higher   For urgent or emergent issues, a gastroenterologist can be reached at any hour by calling [phone]. Do not use MyChart messaging for urgent concerns.    DIET:  We do recommend a small meal at first, but then you may proceed to your regular diet.  Drink plenty of fluids but you should avoid alcoholic beverages for 24 hours.  ACTIVITY:  You should plan to take it easy  for the rest of today and you should NOT DRIVE or use heavy machinery until tomorrow (because of the sedation medicines used during the test).    FOLLOW UP: Our staff will call the number listed on your records 24-72 hours following your procedure to check on you and address any questions or concerns that you may have regarding the information given to you following your procedure. If we do not reach you, we will leave a message.  We will attempt to reach you two times.  During this call, we will ask if you have developed any symptoms of COVID 19. If you develop any symptoms (ie: fever, flu-like symptoms, shortness of breath, cough etc.) before then, please call [phone].  If you test positive for Covid 19 in the 2 weeks post procedure, please call and report this information to us.    If any biopsies were taken you will be contacted by phone or by letter within the next 1-3 weeks.  Please call us at [phone] if you have not heard about the biopsies in 3 weeks.    SIGNATURES/CONFIDENTIALITY: You and/or your care partner have signed paperwork which will be entered into your electronic medical record.  These signatures attest to the fact that that the information above on your After Visit Summary has been reviewed and is understood.  Full responsibility of the confidentiality of this discharge information lies with you and/or your care-partner.  

## 2022-01-11 ENCOUNTER — Telehealth: Payer: Self-pay

## 2022-01-11 NOTE — Telephone Encounter (Signed)
  Follow up Call-     01/10/2022   10:33 AM  Call back number  Post procedure Call Back phone  # [phone]  Permission to leave phone message Yes     Patient questions:  Do you have a fever, pain , or abdominal swelling? No. Pain Score  0 *  Have you tolerated food without any problems? Yes.    Have you been able to return to your normal activities? Yes.    Do you have any questions about your discharge instructions: Diet   No. Medications  No. Follow up visit  No.  Do you have questions or concerns about your Care? No.  Actions: * If pain score is 4 or above: No action needed, pain <4.

## 2022-01-19 ENCOUNTER — Encounter: Admitting: Gastroenterology

## 2022-02-09 ENCOUNTER — Encounter: Payer: Self-pay | Admitting: Gastroenterology

## 2022-03-12 NOTE — Progress Notes (Unsigned)
   Acute Office Visit  Subjective:     Patient ID: Kirsten Herrera, female    DOB: Dec 16, 1975, 46 y.o.   MRN: [id number]  No chief complaint on file.   HPI Patient is in today for ***  ROS      Objective:    There were no vitals taken for this visit. {Vitals History (Optional):23777}  Physical Exam  No results found for any visits on 03/13/22.      Assessment & Plan:   Problem List Items Addressed This Visit   None   No orders of the defined types were placed in this encounter.   No follow-ups on file.  Owens Loffler, DO

## 2022-03-13 ENCOUNTER — Ambulatory Visit (INDEPENDENT_AMBULATORY_CARE_PROVIDER_SITE_OTHER)

## 2022-03-13 ENCOUNTER — Ambulatory Visit (INDEPENDENT_AMBULATORY_CARE_PROVIDER_SITE_OTHER): Admitting: Family Medicine

## 2022-03-13 VITALS — BP 123/82 | HR 64 | Ht 67.0 in | Wt 148.0 lb

## 2022-03-13 DIAGNOSIS — S060X0A Concussion without loss of consciousness, initial encounter: Secondary | ICD-10-CM

## 2022-03-13 DIAGNOSIS — R112 Nausea with vomiting, unspecified: Secondary | ICD-10-CM

## 2022-03-13 DIAGNOSIS — R413 Other amnesia: Secondary | ICD-10-CM | POA: Diagnosis not present

## 2022-03-13 MED ORDER — ONDANSETRON HCL 4 MG PO TABS: 4.0000 mg | ORAL_TABLET | Freq: Three times a day (TID) | ORAL | 0 refills | Status: DC | PRN

## 2022-03-13 NOTE — Assessment & Plan Note (Signed)
-   sent in zofran '4mg'$  20 pills for N/V symptoms - pt has taken medication previously and had no side effects  - believe N/V secondary to concussion

## 2022-03-13 NOTE — Assessment & Plan Note (Signed)
-   likely a sequela of concussion but concerning that she has had progression to memory loss  - head imaging ordered

## 2022-03-13 NOTE — Assessment & Plan Note (Addendum)
-   new neuro sxs of memory loss, consistent nausea and vomiting as well as seeing flashes of light in the periphery in the setting of concussion with heavy objects falling on her head warrants further imaging to rule out any abnormalities - CT head WO ordered stat  - CT head negative for acute abnormality - recommend we focus on headache pain control and controlling N/V. Advised her to follow up with neurology and see if she can get in soon. Likely has post concussion syndrome

## 2022-07-06 ENCOUNTER — Encounter: Payer: Self-pay | Admitting: Sports Medicine

## 2022-07-06 ENCOUNTER — Ambulatory Visit (INDEPENDENT_AMBULATORY_CARE_PROVIDER_SITE_OTHER): Admitting: Sports Medicine

## 2022-07-06 VITALS — BP 135/85 | HR 75

## 2022-07-06 DIAGNOSIS — R5382 Chronic fatigue, unspecified: Secondary | ICD-10-CM | POA: Diagnosis not present

## 2022-07-06 DIAGNOSIS — E559 Vitamin D deficiency, unspecified: Secondary | ICD-10-CM

## 2022-07-06 DIAGNOSIS — E538 Deficiency of other specified B group vitamins: Secondary | ICD-10-CM

## 2022-07-06 NOTE — Assessment & Plan Note (Signed)
This is a very pleasant 46 year old female, she has a history of mood disorder, history of migraines, placed on propranolol by her neurologist, she has noted increasing fatigue, sluggishness, occasional dizziness on standing. Mood is good. She would like a fairly extensive workup which I am happy to do. Also adding a sleep study. I have advised that she go ahead and stop the propranolol for now and talk to her neurologist about another option for migraine prevention. She can see [place] back, ideally her PCP in a month to see if symptoms have improved.

## 2022-07-06 NOTE — Progress Notes (Signed)
    Procedures performed today:    None.  Independent interpretation of notes and tests performed by another provider:   None.  Brief History, Exam, Impression, and Recommendations:    Chronic fatigue This is a very pleasant 46 year old female, she has a history of mood disorder, history of migraines, placed on propranolol by her neurologist, she has noted increasing fatigue, sluggishness, occasional dizziness on standing. Mood is good. She would like a fairly extensive workup which I am happy to do. Also adding a sleep study. I have advised that she go ahead and stop the propranolol for now and talk to her neurologist about another option for migraine prevention. She can see [place] back, ideally her PCP in a month to see if symptoms have improved.  I spent 30 minutes of total time managing this patient today, this includes chart review, face to face, and non-face to face time.  ____________________________________________ Gwen Her. Dianah Field, M.D., ABFM., CAQSM., AME. Primary Care and Sports Medicine [place] [facility]  Adjunct Professor of [place] [employer] [facility] of Medicine  Risk manager

## 2022-07-09 NOTE — Progress Notes (Signed)
Patient informed. 

## 2022-07-11 LAB — COMPLETE METABOLIC PANEL WITH GFR
AG Ratio: 1.6 (calc) (ref 1.0–2.5)
ALT: 17 U/L (ref 6–29)
AST: 19 U/L (ref 10–35)
Albumin: 4.5 g/dL (ref 3.6–5.1)
Alkaline phosphatase (APISO): 47 U/L (ref 31–125)
BUN/Creatinine Ratio: 17 (calc) (ref 6–22)
BUN: 17 mg/dL (ref 7–25)
CO2: 25 mmol/L (ref 20–32)
Calcium: 9.9 mg/dL (ref 8.6–10.2)
Chloride: 100 mmol/L (ref 98–110)
Creat: 1.03 mg/dL — ABNORMAL HIGH (ref 0.50–0.99)
Globulin: 2.8 g/dL (calc) (ref 1.9–3.7)
Glucose, Bld: 112 mg/dL (ref 65–139)
Potassium: 4.1 mmol/L (ref 3.5–5.3)
Sodium: 137 mmol/L (ref 135–146)
Total Bilirubin: 0.4 mg/dL (ref 0.2–1.2)
Total Protein: 7.3 g/dL (ref 6.1–8.1)
eGFR: 68 mL/min/{1.73_m2} (ref >=60)

## 2022-07-11 LAB — CBC
HCT: 40.5 % (ref 35.0–45.0)
Hemoglobin: 13.7 g/dL (ref 11.7–15.5)
MCH: 30.8 pg (ref 27.0–33.0)
MCHC: 33.8 g/dL (ref 32.0–36.0)
MCV: 91 fL (ref 80.0–100.0)
MPV: 9.9 fL (ref 7.5–12.5)
Platelets: 392 10*3/uL (ref 140–400)
RBC: 4.45 10*6/uL (ref 3.80–5.10)
RDW: 11.7 % (ref 11.0–15.0)
WBC: 8.3 10*3/uL (ref 3.8–10.8)

## 2022-07-11 LAB — IRON,TIBC AND FERRITIN PANEL
%SAT: 30 % (calc) (ref 16–45)
Ferritin: 68 ng/mL (ref 16–232)
Iron: 90 ug/dL (ref 40–190)
TIBC: 298 mcg/dL (calc) (ref 250–450)

## 2022-07-11 LAB — LUTEINIZING HORMONE: LH: 7.2 m[IU]/mL

## 2022-07-11 LAB — T3, FREE: T3, Free: 3.2 pg/mL (ref 2.3–4.2)

## 2022-07-11 LAB — PROGESTERONE: Progesterone: <0.5 ng/mL

## 2022-07-11 LAB — FOLLICLE STIMULATING HORMONE: FSH: 4.2 m[IU]/mL

## 2022-07-11 LAB — B12 AND FOLATE PANEL
Folate: 23.8 ng/mL
Vitamin B-12: 504 pg/mL (ref 200–1100)

## 2022-07-11 LAB — ESTROGENS, TOTAL: Estrogen: 322 pg/mL

## 2022-07-11 LAB — VITAMIN D 25 HYDROXY (VIT D DEFICIENCY, FRACTURES): Vit D, 25-Hydroxy: 98 ng/mL (ref 30–100)

## 2022-07-11 LAB — TSH: TSH: 1.27 mIU/L

## 2022-07-11 LAB — T4, FREE: Free T4: 1.1 ng/dL (ref 0.8–1.8)

## 2022-07-18 ENCOUNTER — Ambulatory Visit: Admitting: Medical-Surgical

## 2022-07-20 NOTE — Addendum Note (Signed)
Addended by: Silverio Decamp on: 07/20/2022 12:12 PM   Modules accepted: Orders

## 2022-07-23 ENCOUNTER — Ambulatory Visit
Admission: EM | Admit: 2022-07-23 | Discharge: 2022-07-23 | Disposition: A | Discharge status: Home / Self Care | Attending: Family Medicine | Admitting: Family Medicine

## 2022-07-23 DIAGNOSIS — H6691 Otitis media, unspecified, right ear: Secondary | ICD-10-CM | POA: Diagnosis not present

## 2022-07-23 DIAGNOSIS — H9201 Otalgia, right ear: Secondary | ICD-10-CM | POA: Diagnosis present

## 2022-07-23 DIAGNOSIS — J029 Acute pharyngitis, unspecified: Secondary | ICD-10-CM | POA: Insufficient documentation

## 2022-07-23 LAB — POCT RAPID STREP A (OFFICE): Rapid Strep A Screen: NEGATIVE

## 2022-07-23 MED ORDER — AMOXICILLIN 875 MG PO TABS: 875.0000 mg | ORAL_TABLET | Freq: Two times a day (BID) | ORAL | 0 refills | Status: AC

## 2022-07-23 NOTE — Discharge Instructions (Signed)
May take over-the-counter cough and cold medicine as needed Take the antibiotic Amoxil 2 times a day Make sure you are drinking lots of fluids Consider Flonase to help open up the nasal passages and help with your ear pain See your doctor if not improving by next week

## 2022-07-23 NOTE — ED Provider Notes (Signed)
[facility]    CSN: [id number] Arrival date & time: 07/23/22  1250      History   Chief Complaint Chief Complaint  Patient presents with   Sore Throat    HPI VAEDA WESTALL is a 46 y.o. female.   HPI  Patient states she has a painful sore throat and tiredness since last Thursday.  She now has severe right ear pain.  She has not had any fever.  She has had headaches and body aches and sore throat.  She is COVID test at home was negative.  She has a history of strep throat.  She states this feels like strep.  Past Medical History:  Diagnosis Date   Allergy    seasonal   Anxiety    Asthma    as achild   COVID-19 08/13/2019   also 08/2020   Depression    Seasonal allergies     Patient Active Problem List   Diagnosis Date Noted   Concussion with no loss of consciousness 03/13/2022   Nausea and vomiting 03/13/2022   Memory loss 03/13/2022   Sinus headache 09/25/2021   Abnormal mammogram of left breast 09/03/2018   Recurrent pansinusitis 01/02/2017   Right foot pain 06/12/2016   Vestibular dizziness involving right inner ear 05/16/2016   Hypovitaminosis D 04/21/2015   Chronic fatigue 04/21/2015   Globus pharyngeus 04/12/2015   Dysphagia, pharyngoesophageal phase 03/16/2015   Depression 03/16/2015   Seasonal allergies 03/16/2015   Low back pain 04/05/2014    Past Surgical History:  Procedure Laterality Date   ABDOMINAL SURGERY     c-section x 2    CESAREAN SECTION     TUBAL LIGATION     tuval ligation      OB History   No obstetric history on file.      Home Medications    Prior to Admission medications   Medication Sig Start Date End Date Taking? Authorizing Provider  amoxicillin (AMOXIL) 875 MG tablet Take 1 tablet (875 mg total) by mouth 2 (two) times daily for 10 days. 07/23/22 08/02/22 Yes Raylene Everts, MD  ALPRAZolam Duanne Moron) 0.5 MG tablet Take 0.5 mg by mouth 2 (two) times daily as needed. 03/07/20   [provider]   ipratropium (ATROVENT) 0.06 % nasal spray Place 2 sprays into both nostrils 4 (four) times daily. 10/04/21   Silverio Decamp, MD  lamoTRIgine (LAMICTAL) 200 MG tablet Take by mouth 2 (two) times daily. 05/10/20   [provider]  lisdexamfetamine (VYVANSE) 50 MG capsule Take 50 mg by mouth daily.    [provider]  magnesium oxide (MAG-OX) 400 (240 Mg) MG tablet Take 400 mg by mouth daily. 06/30/22   [provider]  ondansetron (ZOFRAN) 4 MG tablet Take 1 tablet (4 mg total) by mouth every 8 (eight) hours as needed for nausea or vomiting. 03/13/22   Owens Loffler, DO  sertraline (ZOLOFT) 50 MG tablet Take 50 mg by mouth every morning. 12/04/21   [provider]  tiZANidine (ZANAFLEX) 4 MG tablet Take 4 mg by mouth every 6 (six) hours as needed.    [provider]  zolpidem (AMBIEN) 5 MG tablet Take 5 mg by mouth at bedtime as needed. 06/21/22   [provider]    Family History Family History  Problem Relation Age of Onset   Colon polyps Mother    Parkinson's disease Father        Grandmother said Dad had [employer]  White Disease    Cancer Other        throat   Diabetes Other    Stroke Other    Colon cancer Neg Hx    Crohn's disease Neg Hx    Esophageal cancer Neg Hx    Rectal cancer Neg Hx    Stomach cancer Neg Hx     Social History Social History   Tobacco Use   Smoking status: Former    Types: Cigarettes    Quit date: 08/17/2005    Years since quitting: 16.9    Passive exposure: Never   Smokeless tobacco: Never  Vaping Use   Vaping Use: Never used  Substance Use Topics   Alcohol use: Yes    Alcohol/week: 0.0 standard drinks of alcohol   Drug use: No     Allergies   Patient has no known allergies.   Review of Systems Review of Systems See HPI  Physical Exam Triage Vital Signs ED Triage Vitals  Enc Vitals Group     BP 07/23/22 1345 (!) 145/93     Pulse Rate 07/23/22 1345 78     Resp 07/23/22  1345 18     Temp 07/23/22 1345 98.4 F (36.9 C)     Temp Source 07/23/22 1345 Oral     SpO2 07/23/22 1345 100 %     Weight --      Height --      Head Circumference --      Peak Flow --      Pain Score 07/23/22 1347 9     Pain Loc --      Pain Edu? --      Excl. in [place]? --    No data found.  Updated Vital Signs BP (!) 145/93 (BP Location: Right Arm)   Pulse 78   Temp 98.4 F (36.9 C) (Oral)   Resp 18   SpO2 100%    Physical Exam Constitutional:      General: She is not in acute distress.    Appearance: She is well-developed.  HENT:     Head: Normocephalic and atraumatic.     Right Ear: Tympanic membrane is erythematous.     Left Ear: Tympanic membrane and ear canal normal.     Mouth/Throat:     Mouth: Mucous membranes are moist.     Pharynx: Uvula midline. Pharyngeal swelling and posterior oropharyngeal erythema present.     Tonsils: No tonsillar exudate. 1+ on the right. 1+ on the left.  Eyes:     Conjunctiva/sclera: Conjunctivae normal.     Pupils: Pupils are equal, round, and reactive to light.  Cardiovascular:     Rate and Rhythm: Normal rate and regular rhythm.     Heart sounds: Normal heart sounds.  Pulmonary:     Effort: Pulmonary effort is normal. No respiratory distress.     Breath sounds: Normal breath sounds.  Abdominal:     General: There is no distension.     Palpations: Abdomen is soft.  Musculoskeletal:        General: Normal range of motion.     Cervical back: Normal range of motion.  Skin:    General: Skin is warm and dry.  Neurological:     Mental Status: She is alert.      UC Treatments / Results  Labs (all labs ordered are listed, but only abnormal results are displayed) Labs Reviewed  CULTURE, GROUP A STREP [facility])  POCT RAPID STREP A (OFFICE)  EKG   Radiology No results found.  Procedures Procedures (including critical care time)  Medications Ordered in UC Medications - No data to display  Initial Impression /  Assessment and Plan / UC Course  I have reviewed the triage vital signs and the nursing notes.  Pertinent labs & imaging results that were available during my care of the patient were reviewed by me and considered in my medical decision making (see chart for details).     Rapid strep test is negative.  Patient does have some redness of her ears consistent with otitis.  Will treat with antibiotics.  Sent her rapid strep for culture.  Symptomatic care discussed.  Return as needed Final Clinical Impressions(s) / UC Diagnoses   Final diagnoses:  Sore throat  Acute right otitis media     Discharge Instructions      May take over-the-counter cough and cold medicine as needed Take the antibiotic Amoxil 2 times a day Make sure you are drinking lots of fluids Consider Flonase to help open up the nasal passages and help with your ear pain See your doctor if not improving by next week   ED Prescriptions     Medication Sig Dispense Auth. Provider   amoxicillin (AMOXIL) 875 MG tablet Take 1 tablet (875 mg total) by mouth 2 (two) times daily for 10 days. 20 tablet Raylene Everts, MD      PDMP not reviewed this encounter.   Raylene Everts, MD 07/23/22 [phone]

## 2022-07-23 NOTE — ED Triage Notes (Addendum)
Pt c/o sore throat and lethargy since Thurs. Also c/o RT ear pain. Denies fever.  Taking tylenol head and cold prn. COVID at home neg yesterday. Hx of strep.

## 2022-07-26 LAB — CULTURE, GROUP A STREP (THRC)

## 2022-08-03 ENCOUNTER — Encounter: Payer: Self-pay | Admitting: Sports Medicine

## 2022-08-03 ENCOUNTER — Ambulatory Visit (INDEPENDENT_AMBULATORY_CARE_PROVIDER_SITE_OTHER): Admitting: Sports Medicine

## 2022-08-03 VITALS — BP 139/93 | HR 85 | Wt 160.0 lb

## 2022-08-03 DIAGNOSIS — R5382 Chronic fatigue, unspecified: Secondary | ICD-10-CM | POA: Diagnosis not present

## 2022-08-03 NOTE — Assessment & Plan Note (Signed)
Pleasant 46 year old female, history of mood disorder, migraines, she was placed on propranolol by neurologist, was having increasing fatigue, sluggishness, dizziness on standing, we discontinued propranolol and her symptoms have resolved, she still has a sleep study pending. With regards to her migraines they are trying to get her approved for Emgality, sounds like this has not been successful, I have asked her to discuss Nurtec and Aimovig with her neurologist. Return to see me as needed.

## 2022-08-03 NOTE — Progress Notes (Signed)
    Procedures performed today:    None.  Independent interpretation of notes and tests performed by another provider:   None.  Brief History, Exam, Impression, and Recommendations:    Chronic fatigue Pleasant 46 year old female, history of mood disorder, migraines, she was placed on propranolol by neurologist, was having increasing fatigue, sluggishness, dizziness on standing, we discontinued propranolol and her symptoms have resolved, she still has a sleep study pending. With regards to her migraines they are trying to get her approved for Emgality, sounds like this has not been successful, I have asked her to discuss Nurtec and Aimovig with her neurologist. Return to see me as needed.    ____________________________________________ Gwen Her. Dianah Field, M.D., ABFM., CAQSM., AME. Primary Care and Sports Medicine  [facility]  Adjunct Professor of [place] [employer] [facility] of Medicine  Risk manager

## 2022-08-17 ENCOUNTER — Ambulatory Visit (HOSPITAL_BASED_OUTPATIENT_CLINIC_OR_DEPARTMENT_OTHER): Admitting: Internal Medicine

## 2022-08-17 DIAGNOSIS — I456 Pre-excitation syndrome: Secondary | ICD-10-CM | POA: Insufficient documentation

## 2022-08-17 DIAGNOSIS — R5382 Chronic fatigue, unspecified: Secondary | ICD-10-CM

## 2022-08-23 ENCOUNTER — Ambulatory Visit (HOSPITAL_BASED_OUTPATIENT_CLINIC_OR_DEPARTMENT_OTHER): Attending: Sports Medicine | Admitting: Internal Medicine

## 2022-08-23 DIAGNOSIS — R5382 Chronic fatigue, unspecified: Secondary | ICD-10-CM

## 2022-09-02 DIAGNOSIS — R5382 Chronic fatigue, unspecified: Secondary | ICD-10-CM | POA: Diagnosis not present

## 2022-09-02 NOTE — Procedures (Signed)
    Patient Name: Kirsten, Herrera Date: 08/24/2022 Gender: Female D.O.B: 29-Nov-1975 Age (years): 46 Referring Provider: Gwen Her Thekkekandam Height (inches): 39 Interpreting Physician: Baird Lyons MD, ABSM Weight (lbs): 150 RPSGT: Jacolyn Reedy BMI: 23 MRN: [id number] Neck Size: 14.00  CLINICAL INFORMATION Sleep Study Type: HST Indication for sleep study: Insomnia with OSA Epworth Sleepiness Score: 6  SLEEP STUDY TECHNIQUE A multi-channel overnight portable sleep study was performed. The channels recorded were: nasal airflow, thoracic respiratory movement, and oxygen saturation with a pulse oximetry. Snoring was also monitored.  MEDICATIONS Patient self administered medications include: none reported.  SLEEP ARCHITECTURE Patient was studied for 381.4 minutes. The sleep efficiency was 100.0 % and the patient was supine for 0%. The arousal index was 0.0 per hour.  RESPIRATORY PARAMETERS The overall AHI was 8.2 per hour, with a central apnea index of 0 per hour. The oxygen nadir was 89% during sleep.  CARDIAC DATA Mean heart rate during sleep was 65.1 bpm.  IMPRESSIONS - Mild obstructive sleep apnea occurred during this study (AHI = 8.2/h). - Mild oxygen desaturation was noted during this study (Min O2 = 89%, Mean 97%). - Patient snored.  DIAGNOSIS - Obstructive Sleep Apnea (G47.33)  RECOMMENDATIONS - Treatment for mild OSA is guided by symptoms and co-morbidity. Conservative measures may include observation, weight loss and sleep position off back. Other options, including CPAP, a fitted oral appliance or ENT evaluation, would be based on clinical judgment. - Be careful with alcohol, sedatives and other CNS depressants that may worsen sleep apnea and disrupt normal sleep architecture. - Sleep hygiene should be reviewed to assess factors that may improve sleep quality. - Weight management and regular exercise should be initiated or  continued.  [Electronically signed] 09/02/2022 11:06 AM  Baird Lyons MD, ABSM Diplomate, American Board of Sleep Medicine NPI: [id number]                         [place] [place] [facility]  ELECTRONICALLY SIGNED ON:  09/02/2022, 11:03 AM [place] PH: [phone]   FX: [phone] [place]

## 2022-11-19 ENCOUNTER — Encounter: Payer: Self-pay | Admitting: *Deleted

## 2023-03-20 ENCOUNTER — Encounter: Payer: Self-pay | Admitting: Medical-Surgical

## 2023-05-05 ENCOUNTER — Other Ambulatory Visit: Payer: Self-pay

## 2023-05-05 ENCOUNTER — Ambulatory Visit
Admission: EM | Admit: 2023-05-05 | Discharge: 2023-05-05 | Disposition: A | Discharge status: Home / Self Care | Attending: Family Medicine | Admitting: Family Medicine

## 2023-05-05 DIAGNOSIS — J209 Acute bronchitis, unspecified: Secondary | ICD-10-CM | POA: Diagnosis not present

## 2023-05-05 MED ORDER — PROMETHAZINE-DM 6.25-15 MG/5ML PO SYRP: 5.0000 mL | ORAL_SOLUTION | Freq: Four times a day (QID) | ORAL | 0 refills | Status: DC | PRN

## 2023-05-05 MED ORDER — PREDNISONE 50 MG PO TABS: ORAL_TABLET | ORAL | 0 refills | Status: DC

## 2023-05-05 NOTE — ED Provider Notes (Signed)
[facility]    CSN: [id number] Arrival date & time: 05/05/23  0844      History   Chief Complaint No chief complaint on file.   HPI Kirsten Herrera is a 47 y.o. female.   HPI  Patient has had cough and congestion for 5 days.  Had some sweats and bodyaches.  Feels very fatigued.  Concern for COVID, however she did have COVID in August of this year.  I explained to her that her immunity should last 3 months.  She does have seasonal allergies and had asthma as a child.  Past Medical History:  Diagnosis Date   Allergy    seasonal   Anxiety    Asthma    as achild   COVID-19 08/13/2019   also 08/2020   Depression    Seasonal allergies     Patient Active Problem List   Diagnosis Date Noted   Concussion with no loss of consciousness 03/13/2022   Nausea and vomiting 03/13/2022   Memory loss 03/13/2022   Sinus headache 09/25/2021   Abnormal mammogram of left breast 09/03/2018   Recurrent pansinusitis 01/02/2017   Right foot pain 06/12/2016   Vestibular dizziness involving right inner ear 05/16/2016   Hypovitaminosis D 04/21/2015   Chronic fatigue 04/21/2015   Globus pharyngeus 04/12/2015   Dysphagia, pharyngoesophageal phase 03/16/2015   Depression 03/16/2015   Seasonal allergies 03/16/2015   Low back pain 04/05/2014    Past Surgical History:  Procedure Laterality Date   ABDOMINAL SURGERY     c-section x 2    CESAREAN SECTION     TUBAL LIGATION     tuval ligation      OB History   No obstetric history on file.      Home Medications    Prior to Admission medications   Medication Sig Start Date End Date Taking? Authorizing Provider  predniSONE (DELTASONE) 50 MG tablet Take once a day for 5 days.  Take with food 05/05/23  Yes Eustace Moore, MD  promethazine-dextromethorphan (PROMETHAZINE-DM) 6.25-15 MG/5ML syrup Take 5 mLs by mouth 4 (four) times daily as needed for cough. 05/05/23  Yes Eustace Moore, MD    Family History Family  History  Problem Relation Age of Onset   Colon polyps Mother    Parkinson's disease Father        Grandmother said Dad had [employer] Parkinson White Disease    Cancer Other        throat   Diabetes Other    Stroke Other    Colon cancer Neg Hx    Crohn's disease Neg Hx    Esophageal cancer Neg Hx    Rectal cancer Neg Hx    Stomach cancer Neg Hx     Social History Social History   Tobacco Use   Smoking status: Former    Current packs/day: 0.00    Types: Cigarettes    Quit date: 08/17/2005    Years since quitting: 17.7    Passive exposure: Never   Smokeless tobacco: Never  Vaping Use   Vaping status: Never Used  Substance Use Topics   Alcohol use: Yes    Alcohol/week: 0.0 standard drinks of alcohol   Drug use: No     Allergies   Patient has no known allergies.   Review of Systems Review of Systems See HPI  Physical Exam Triage Vital Signs ED Triage Vitals  Encounter Vitals Group     BP 05/05/23 0849 (!) 146/92  Systolic BP Percentile --      Diastolic BP Percentile --      Pulse Rate 05/05/23 0849 76     Resp 05/05/23 0849 16     Temp 05/05/23 0849 98 F (36.7 C)     Temp Source 05/05/23 0849 Oral     SpO2 05/05/23 0849 99 %     Weight --      Height --      Head Circumference --      Peak Flow --      Pain Score 05/05/23 0852 6     Pain Loc --      Pain Education --      Exclude from Growth Chart --    No data found.  Updated Vital Signs BP (!) 146/92 (BP Location: Left Arm)   Pulse 76   Temp 98 F (36.7 C) (Oral)   Resp 16   SpO2 99%       Physical Exam Constitutional:      General: She is not in acute distress.    Appearance: She is well-developed. She is ill-appearing.  HENT:     Head: Normocephalic and atraumatic.     Right Ear: Tympanic membrane normal.     Left Ear: Tympanic membrane normal.     Nose: No rhinorrhea.     Mouth/Throat:     Mouth: Mucous membranes are moist.     Pharynx: No posterior oropharyngeal erythema.   Eyes:     Conjunctiva/sclera: Conjunctivae normal.     Pupils: Pupils are equal, round, and reactive to light.  Cardiovascular:     Rate and Rhythm: Normal rate.     Heart sounds: Normal heart sounds.  Pulmonary:     Effort: Pulmonary effort is normal. No respiratory distress.     Breath sounds: Normal breath sounds.  Abdominal:     General: There is no distension.     Palpations: Abdomen is soft.  Musculoskeletal:        General: Normal range of motion.     Cervical back: Normal range of motion.  Lymphadenopathy:     Cervical: No cervical adenopathy.  Skin:    General: Skin is warm and dry.  Neurological:     Mental Status: She is alert.      UC Treatments / Results  Labs (all labs ordered are listed, but only abnormal results are displayed) Labs Reviewed - No data to display  EKG   Radiology No results found.  Procedures Procedures (including critical care time)  Medications Ordered in UC Medications - No data to display  Initial Impression / Assessment and Plan / UC Course  I have reviewed the triage vital signs and the nursing notes.  Pertinent labs & imaging results that were available during my care of the patient were reviewed by me and considered in my medical decision making (see chart for details).    Discussed that this is likely a viral infection.  The biotics will not help her get better faster.  Unlikely COVID.  Symptomatic care reviewed Final Clinical Impressions(s) / UC Diagnoses   Final diagnoses:  Acute bronchitis, unspecified organism     Discharge Instructions      Make sure you are drinking lots of fluids Take prednisone once a day for 5 days I have prescribed a stronger cough medicine.  This may make you drowsy.  It will help you sleep See your doctor if not improving by next week   ED Prescriptions  Medication Sig Dispense Auth. Provider   predniSONE (DELTASONE) 50 MG tablet Take once a day for 5 days.  Take with food 5  tablet Eustace Moore, MD   promethazine-dextromethorphan (PROMETHAZINE-DM) 6.25-15 MG/5ML syrup Take 5 mLs by mouth 4 (four) times daily as needed for cough. 118 mL Eustace Moore, MD      PDMP not reviewed this encounter.   Eustace Moore, MD 05/05/23 1415

## 2023-05-05 NOTE — ED Triage Notes (Signed)
Cough and congestion x 5 days, unsure of fever.

## 2023-05-05 NOTE — Discharge Instructions (Signed)
Make sure you are drinking lots of fluids Take prednisone once a day for 5 days I have prescribed a stronger cough medicine.  This may make you drowsy.  It will help you sleep See your doctor if not improving by next week

## 2024-01-15 ENCOUNTER — Encounter: Payer: Self-pay | Admitting: Medical-Surgical

## 2024-01-15 ENCOUNTER — Ambulatory Visit: Payer: Self-pay | Admitting: Medical-Surgical

## 2024-01-15 ENCOUNTER — Ambulatory Visit: Admitting: Medical-Surgical

## 2024-01-15 ENCOUNTER — Ambulatory Visit

## 2024-01-15 VITALS — BP 135/81 | HR 68 | Resp 20 | Ht 67.0 in | Wt 172.0 lb

## 2024-01-15 DIAGNOSIS — M79671 Pain in right foot: Secondary | ICD-10-CM

## 2024-01-15 DIAGNOSIS — F909 Attention-deficit hyperactivity disorder, unspecified type: Secondary | ICD-10-CM

## 2024-01-15 MED ORDER — LISDEXAMFETAMINE DIMESYLATE 40 MG PO CAPS: 40.0000 mg | ORAL_CAPSULE | ORAL | 0 refills | Status: DC

## 2024-01-15 NOTE — Progress Notes (Signed)
        Established patient visit  History, exam, impression, and plan:  1. Right foot pain (Primary) Pleasant 48 year old female presenting today to discus right foot pain. 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 Over the past several weeks, she has begun to experience numbness/tingling and pain at the middle 3 toes of the right foot. Has discomfort of those toes that often feels swollen and tight. Pain feels like she is stepping on a pebble. Has tried different shoes and insoles but nothing seems to be helping. No alleviating factors identified. On exam, her right foot is warm with 2+ DP and PT pulses. Normal color with brisk capillary refill. Pain with palpation at the ball of the foot just between the second/third digits. Pain with compression of the toes laterally. Symptoms consistent with Morton's neuroma but would like to make sure there is nothing else contributing. Plan for right foot x-rays today. Depending on results, may recommend follow up with Dr. Elva Hamburger vs referral to podiatry.  - DG Foot Complete Right; Future  2. Attention deficit hyperactivity disorder (ADHD), unspecified ADHD type Reports a history of ADHD that has been managed by psychiatry in the past. Has tried several medications for mood and ADHD management over the years but felt like she was overmedicated and overwhelmed. Decided to do a medication purge for the last two months. At this point, would like to try treating the ADHD first then see if there is additional need for medication to manage mood. Took Vyvanse in the past and felt that it worked well for her and was well tolerated. Thinks she may have been on 50mg  daily at one point. After discussion, plan to restart Vyvanse at 40mg  daily for 2 weeks then touch base via MyChart if she wants to bump up to 50mg  instead. BP elevated on arrival but better on recheck. Monitor BP at home with a goal of 130/80 or less.    Procedures performed this  visit: None.  Return in about 3 months (around 04/16/2024) for ADHD follow up.  __________________________________ Maryl Snook, DNP, APRN, FNP-BC Primary Care and Sports Medicine [facility] [place]

## 2024-01-24 ENCOUNTER — Ambulatory Visit: Admitting: Sports Medicine

## 2024-01-24 ENCOUNTER — Encounter: Payer: Self-pay | Admitting: Medical-Surgical

## 2024-01-24 DIAGNOSIS — M79671 Pain in right foot: Secondary | ICD-10-CM | POA: Diagnosis not present

## 2024-01-24 MED ORDER — LISDEXAMFETAMINE DIMESYLATE 50 MG PO CAPS: 50.0000 mg | ORAL_CAPSULE | ORAL | 0 refills | Status: DC

## 2024-01-24 NOTE — Assessment & Plan Note (Signed)
 Pleasant 48 year old female, she has had several months of pain right foot with numbness and tingling 2nd through 4th toes. There is also some pain over the metatarsal shafts. She does some running. X-rays were unrevealing. Examination of her foot does reveal pes cavus, she also has drop of the transverse arch with clawing of the toes and abnormal callus between the 2nd through 4th metatarsal heads plantar aspect. On exam she does have some tenderness over the 2nd and 3rd metatarsal shafts, she also has pain plantar MTPs. There is also some discomfort with compression of the 2nd and 3rd metatarsal heads. Considering the pain over the shafts I do suspect this is more of a stress type process. Differential does still include Morton's neuroma and intermetatarsal bursitis. I would expect more numbness in the distribution of the proper digital nerves in 1 interspace from a Morton's neuroma. I have put her in a postop shoe with a metatarsal pad. Adding an MRI. Return to see me in 4 weeks.

## 2024-01-24 NOTE — Progress Notes (Signed)
    Procedures performed today:    None.  Independent interpretation of notes and tests performed by another provider:   None.  Brief History, Exam, Impression, and Recommendations:    Right foot pain Pleasant 48 year old female, she has had several months of pain right foot with numbness and tingling 2nd through 4th toes. There is also some pain over the metatarsal shafts. She does some running. X-rays were unrevealing. Examination of her foot does reveal pes cavus, she also has drop of the transverse arch with clawing of the toes and abnormal callus between the 2nd through 4th metatarsal heads plantar aspect. On exam she does have some tenderness over the 2nd and 3rd metatarsal shafts, she also has pain plantar MTPs. There is also some discomfort with compression of the 2nd and 3rd metatarsal heads. Considering the pain over the shafts I do suspect this is more of a stress type process. Differential does still include Morton's neuroma and intermetatarsal bursitis. I would expect more numbness in the distribution of the proper digital nerves in 1 interspace from a Morton's neuroma. I have put her in a postop shoe with a metatarsal pad. Adding an MRI. Return to see me in 4 weeks.  I spent 30 minutes of total time managing this patient today, this includes chart review, face to face, and non-face to face time.  ____________________________________________ Joselyn Nicely. Sandy Crumb, M.D., ABFM., CAQSM., AME. Primary Care and Sports Medicine [facility]  Adjunct Professor of [facility] Medicine  [employer]  Restaurant manager, fast food

## 2024-01-26 ENCOUNTER — Ambulatory Visit

## 2024-01-26 DIAGNOSIS — M79671 Pain in right foot: Secondary | ICD-10-CM

## 2024-01-27 ENCOUNTER — Ambulatory Visit: Payer: Self-pay | Admitting: Sports Medicine

## 2024-01-27 DIAGNOSIS — M79671 Pain in right foot: Secondary | ICD-10-CM

## 2024-01-29 ENCOUNTER — Encounter: Payer: Self-pay | Admitting: Medical-Surgical

## 2024-01-29 MED ORDER — TRAMADOL HCL 50 MG PO TABS: 50.0000 mg | ORAL_TABLET | Freq: Three times a day (TID) | ORAL | 0 refills | Status: DC | PRN

## 2024-01-29 MED ORDER — IBUPROFEN 800 MG PO TABS: 800.0000 mg | ORAL_TABLET | Freq: Three times a day (TID) | ORAL | 2 refills | Status: AC | PRN

## 2024-01-29 NOTE — Telephone Encounter (Signed)
 I sent a medical record request to [facility] MD Fax number [phone]

## 2024-01-29 NOTE — Addendum Note (Signed)
 Addended by: CURTIS DEBBY PARAS on: 01/29/2024 11:40 AM   Modules accepted: Orders

## 2024-01-29 NOTE — Telephone Encounter (Signed)
Please see recent message sent by pt and advise.

## 2024-02-21 ENCOUNTER — Ambulatory Visit

## 2024-02-21 ENCOUNTER — Encounter: Payer: Self-pay | Admitting: Sports Medicine

## 2024-02-21 ENCOUNTER — Ambulatory Visit: Admitting: Sports Medicine

## 2024-02-21 DIAGNOSIS — M79671 Pain in right foot: Secondary | ICD-10-CM

## 2024-02-21 DIAGNOSIS — M5416 Radiculopathy, lumbar region: Secondary | ICD-10-CM

## 2024-02-21 MED ORDER — GABAPENTIN 300 MG PO CAPS: ORAL_CAPSULE | ORAL | 3 refills | Status: DC

## 2024-02-21 NOTE — Assessment & Plan Note (Addendum)
 Very pleasant 48 year old female, she has had many months of pain right foot with numbness and tingling 2nd through 4th toes, on exam initially she had some pain over the metatarsal shafts, she also had some pes cavus with drop in the transverse arch, clawing of the 2nd through 4th toes with abnormal callus. Due to the above we considered a stress type injury versus Morton neuroma/intermetatarsal bursitis. Her numbness was not however in the exact distribution 1 would expect for a Morton's neuroma, it was more in a radicular distribution. Due to the discrete tenderness with examination of her foot itself we proceeded with MRI of the foot that was essentially negative. She was in a postop shoe, with a metatarsal pad for approximately a month, did not notice any improvement whatsoever. Back feels okay however review an MRI from 10 years ago does show L4-L5 disc protrusion with annular tearing. Unfortunately she has not improved, we will shift her focus to her lumbar spine. At this point she has failed over 6 weeks of physician directed physical therapy with medications. Proceed with x-rays and MRI of the lumbar spine with suspicion for right L5 versus S1 radiculitis. Adding some Neurontin . Home physical therapy. Return to see me for MRI results.  Update:  I did personally review the lumbar spine MRI, I discussed it with the radiologist, the right L5 nerve does abut the disc, and there are some Tarlov versus perineural cysts deep within the S1 foramen potentially compressing the S1 nerve root, this would indicate potential right L5 and/or right S1 nerve root compression.   Awaiting patient input as to whether we continue with Neurontin  up taper or try selective S1 epidural on the right first, and then a selective L5-S1 transforaminal epidural if that fails.

## 2024-02-21 NOTE — Progress Notes (Addendum)
    Procedures performed today:    None.  Independent interpretation of notes and tests performed by another provider:   None.  Brief History, Exam, Impression, and Recommendations:    Right lumbar radiculitis Very pleasant 48 year old female, she has had many months of pain right foot with numbness and tingling 2nd through 4th toes, on exam initially she had some pain over the metatarsal shafts, she also had some pes cavus with drop in the transverse arch, clawing of the 2nd through 4th toes with abnormal callus. Due to the above we considered a stress type injury versus Morton neuroma/intermetatarsal bursitis. Her numbness was not however in the exact distribution 1 would expect for a Morton's neuroma, it was more in a radicular distribution. Due to the discrete tenderness with examination of her foot itself we proceeded with MRI of the foot that was essentially negative. She was in a postop shoe, with a metatarsal pad for approximately a month, did not notice any improvement whatsoever. Back feels okay however review an MRI from 10 years ago does show L4-L5 disc protrusion with annular tearing. Unfortunately she has not improved, we will shift her focus to her lumbar spine. At this point she has failed over 6 weeks of physician directed physical therapy with medications. Proceed with x-rays and MRI of the lumbar spine with suspicion for right L5 versus S1 radiculitis. Adding some Neurontin . Home physical therapy. Return to see me for MRI results.  Update:  I did personally review the lumbar spine MRI, I discussed it with the radiologist, the right L5 nerve does abut the disc, and there are some Tarlov versus perineural cysts deep within the S1 foramen potentially compressing the S1 nerve root, this would indicate potential right L5 and/or right S1 nerve root compression.   Awaiting patient input as to whether we continue with Neurontin  up taper or try selective S1 epidural on the right  first, and then a selective L5-S1 transforaminal epidural if that fails.    ____________________________________________ Debby PARAS. Curtis, M.D., ABFM., CAQSM., AME. Primary Care and Sports Medicine [facility]  Adjunct Professor of [facility] Medicine  [employer]  Restaurant manager, fast food

## 2024-02-23 ENCOUNTER — Ambulatory Visit

## 2024-02-23 DIAGNOSIS — M79671 Pain in right foot: Secondary | ICD-10-CM | POA: Diagnosis not present

## 2024-02-26 ENCOUNTER — Ambulatory Visit: Payer: Self-pay | Admitting: Sports Medicine

## 2024-02-28 ENCOUNTER — Encounter: Payer: Self-pay | Admitting: Medical-Surgical

## 2024-02-28 MED ORDER — NP THYROID 90 MG PO TABS: ORAL_TABLET | ORAL | 3 refills | Status: AC

## 2024-02-28 MED ORDER — LISDEXAMFETAMINE DIMESYLATE 50 MG PO CAPS: 50.0000 mg | ORAL_CAPSULE | ORAL | 0 refills | Status: DC

## 2024-02-28 NOTE — Telephone Encounter (Signed)
 Patient requesting rx rf of  Vyanse 50mg   Last written by cody matthews 01/24/2024 And  NP thyroid  Last written by historical provider  Last OV 02/21/2024 Dr Curtis 01/15/2024 Joy Jessup,NP  Upcoming appt 04/16/2024 Zada Palin, NP

## 2024-03-10 NOTE — Procedures (Signed)
 SABRA

## 2024-04-07 ENCOUNTER — Encounter: Payer: Self-pay | Admitting: Medical-Surgical

## 2024-04-07 ENCOUNTER — Encounter: Payer: Self-pay | Admitting: Sports Medicine

## 2024-04-07 MED ORDER — LISDEXAMFETAMINE DIMESYLATE 50 MG PO CAPS: 50.0000 mg | ORAL_CAPSULE | ORAL | 0 refills | Status: DC

## 2024-04-09 ENCOUNTER — Other Ambulatory Visit: Payer: Self-pay

## 2024-04-09 ENCOUNTER — Ambulatory Visit (INDEPENDENT_AMBULATORY_CARE_PROVIDER_SITE_OTHER)

## 2024-04-09 ENCOUNTER — Ambulatory Visit: Admitting: Sports Medicine

## 2024-04-09 VITALS — BP 130/88 | Ht 67.0 in | Wt 172.0 lb

## 2024-04-09 DIAGNOSIS — M79671 Pain in right foot: Secondary | ICD-10-CM

## 2024-04-09 DIAGNOSIS — M5416 Radiculopathy, lumbar region: Secondary | ICD-10-CM

## 2024-04-09 DIAGNOSIS — M7751 Other enthesopathy of right foot: Secondary | ICD-10-CM

## 2024-04-09 NOTE — Progress Notes (Signed)
 Subjective:    Patient ID: Kirsten Herrera, female    DOB: 48 y.o., [birth year]   MRN: [id number]  HPI  Chief Complaint: Chronic right foot pain  Former patient of Dr. Curtis following with him for right foot pain for many months of pain in right foot with numbness and tingling 2nd through 4th toes initially she had some pain over the metatarsal shafts some pes cavus with drop in the transverse arch clawing of the 2nd through 4th toes with abnormal callus.  02/21/2024 visit with Dr. Curtis: Initially considered stress-type injury versus Morton neuroma or intermetatarsal bursitis. Her numbness was not however in the exact distribution 1 would expect for a Morton's neuroma, it was more in a radicular distribution. Due to the discrete tenderness with examination of her foot itself we proceeded with MRI of the foot that was essentially negative. She was in a postop shoe, with a metatarsal pad for approximately a month, did not notice any improvement whatsoever. Back feels okay however review an MRI from 10 years ago does show L4-L5 disc protrusion with annular tearing. Unfortunately she has not improved, we will shift her focus to her lumbar spine. At this point she has failed over 6 weeks of physician directed physical therapy with medications. Proceed with x-rays and MRI of the lumbar spine with suspicion for right L5 versus S1 radiculitis.  Today patient reports Neurontin  helped at first but no longer helping now Ibuprofen  800 regularly.  MRI of lumbar spine obtained on 02/23/2024 per discussion had between Dr. Curtis and radiologist at the time of the scan reveals that the right L5 nerve does abut the disc, and there are some Tarlov versus perineural cyst deep within the S1 foramen potentially compressing the S1 nerve root, this would indicate potential right L5 and/or right S1 nerve root compression.    Objective:   Physical Exam Vitals:   04/09/24 0930  BP: 130/88    Right Foot ( compared to normal ) Inspection: no swelling, normal longitudinal arches without significant collapse. Normal transverse arches without significant collapse. Palpation: TTP - ATFL, - CFL, - PTFL, - anterior joint line, - navicular, - base of 5th metatarsal, - deltoid, -  tarsal, - metatarsal, + MTP joints both on plantar and dorsal aspects. - achilles, - plantar fascia AROM/PROM: Full plantarflexion, dorsiflexion, eversion, inversion. FROM  of the great toe Strength: 5/5 plantarflexion, 5/5 dorsiflexion, 5/5 eversion, 5/5 inversion Special tests: - talar tilt test, - anterior drawer, - proximal squeeze, - calcaneal squeeze  Straight leg raise and exaggeration of back extension do not reproduce symptoms in right foot.  Limited ultrasound exam of the right foot: Dorsal: -The first through fifth tarso-metatarsal joints appear normal without effusion or signs of osteoarthritis. -The extensor digitorum longus and extensor hallucis longus tendons appear normal. -The first through fifth metatarsals appear normal. - The 1st, and 5th MTP joints appear normal. - The 1st through 5th PIP, IP, DIP joints appear normal.  Plantar: -The flexor hallucis longus tendon appears normal to its distal insertion. No abnormalities of the sesamoids of the first MTP. -The flexor digitorum tendons appear normal in the 1st,4th, 5th digits -The flexor digitorum tendons appear to carry a modest amount of peritendinous fluid as they traverse the MTP joint particularly over the 2nd and 3rd digits. (3rd>>2nd). -There is minimal space between the metatarsal heads of the 2nd and 3rd digits. -No sonographic Mulder sign. -The plantar plates of the first through fifth MCP joints appear normal. -The intermetatarsal  bursae appear mildly enlarged and the 2nd and 3rd interspaces. - No obvious sign of Morton's neuroma.  Impression: - Capsulitis of third MTP joint with possible intermetatarsal bursitis of 2nd and  3rd interspaces.  Ultrasound examination performed and interpreted by Redell Robes, DO      Assessment & Plan:   Kirsten Herrera is a 48 y.o. with chronic right foot pain presumed to be attributed to lumbar etiology.  Provocative exam maneuvers for the lumbar spine do not appear to provoke her right foot symptoms.  Palpation of the right foot does reproduce the same character of pain she is experiencing at home.  Ultrasound does reveal concern for capsulitis of the third MTP joint with concern for possible intermetatarsal bursitis which may explain why the metatarsal pad worsened her symptoms for 1 month though I would have expected the intermetatarsal bursitis to have improved some if this was a primary driver for her.  Other considerations within the differential possibility include medial plantar nerve compression or superficial peroneal nerve compression though these are less likely based on my exam today.  It is possible that she could had a lumbar etiology of her pain in her right foot, but I will speak with one of my colleagues and get back to her regarding next steps.  Query if third MTP joint corticosteroid injection plus wide toebox footwear allowing for adequate toe splay may be appropriate. She can discontinue Neurontin  if this is not helping.  Switch from ibuprofen  to Voltaren gel gradually.

## 2024-04-16 ENCOUNTER — Ambulatory Visit: Admitting: Medical-Surgical

## 2024-04-16 ENCOUNTER — Encounter: Payer: Self-pay | Admitting: Medical-Surgical

## 2024-04-16 VITALS — BP 125/80 | HR 71 | Resp 20 | Ht 67.0 in | Wt 163.0 lb

## 2024-04-16 DIAGNOSIS — R946 Abnormal results of thyroid function studies: Secondary | ICD-10-CM | POA: Diagnosis not present

## 2024-04-16 DIAGNOSIS — F909 Attention-deficit hyperactivity disorder, unspecified type: Secondary | ICD-10-CM | POA: Insufficient documentation

## 2024-04-16 MED ORDER — LISDEXAMFETAMINE DIMESYLATE 50 MG PO CAPS: 50.0000 mg | ORAL_CAPSULE | ORAL | 0 refills | Status: DC

## 2024-04-16 NOTE — Progress Notes (Signed)
        Established patient visit   History of Present Illness   Discussed the use of AI scribe software for clinical note transcription with the patient, who gave verbal consent to proceed.  History of Present Illness   Kirsten Herrera is a 48 year old female with ADHD who presents for a follow-up visit for ADHD management.  Attention deficit hyperactivity disorder (adhd) symptoms and stimulant therapy - Vyvanse  50 mg has significantly improved task completion and reduced symptoms of depression and anxiety. - Appetite suppression is present and appreciated for reducing snacking. - Weight decreased from 172 pounds in June to 163 pounds currently, attributed to healthier eating habits and increased physical activity.  Thyroid  function and associated symptoms - Taking NP Thyroid  for thyroid  condition. - No palpitations. - Occasional brittle nails. - No significant hair loss. - No gastrointestinal symptoms such as constipation or diarrhea.     Physical Exam   Physical Exam Vitals reviewed.  Constitutional:      General: She is not in acute distress.    Appearance: Normal appearance. She is not ill-appearing.  HENT:     Head: Normocephalic and atraumatic.  Cardiovascular:     Rate and Rhythm: Normal rate and regular rhythm.     Pulses: Normal pulses.     Heart sounds: Normal heart sounds. No murmur heard.    No friction rub. No gallop.  Pulmonary:     Effort: Pulmonary effort is normal. No respiratory distress.     Breath sounds: Normal breath sounds. No wheezing.  Skin:    General: Skin is warm and dry.  Neurological:     Mental Status: She is alert and oriented to person, place, and time.  Psychiatric:        Mood and Affect: Mood normal.        Behavior: Behavior normal.        Thought Content: Thought content normal.        Judgment: Judgment normal.    Assessment & Plan   Assessment and Plan    Attention-deficit hyperactivity disorder (ADHD) ADHD well-managed  with Vyvanse  50 mg. Improved functionality, mood, productivity. No adverse effects reported. Positive weight loss. - Continue Vyvanse  50 mg daily. - Refill Vyvanse  prescription for three months.  Abnormal thyroid  test No documented thyroid  condition but was started on NP Thyroid  by [facility] [place]. Requested thyroid  function testing. - Order thyroid  function test.     Follow up   Return in about 3 months (around 07/16/2024) for ADHD follow up. __________________________________ Zada FREDRIK Palin, DNP, APRN, FNP-BC Primary Care and Sports Medicine [facility]

## 2024-04-17 ENCOUNTER — Ambulatory Visit: Payer: Self-pay | Admitting: Medical-Surgical

## 2024-04-17 LAB — TSH+FREE T4
Free T4: 1.09 ng/dL (ref 0.82–1.77)
TSH: 0.858 u[IU]/mL (ref 0.450–4.500)

## 2024-05-07 ENCOUNTER — Other Ambulatory Visit: Payer: Self-pay

## 2024-05-07 ENCOUNTER — Ambulatory Visit

## 2024-05-07 VITALS — BP 126/84 | Ht 66.0 in | Wt 160.0 lb

## 2024-05-07 DIAGNOSIS — M7751 Other enthesopathy of right foot: Secondary | ICD-10-CM | POA: Diagnosis not present

## 2024-05-07 MED ORDER — METHYLPREDNISOLONE ACETATE 40 MG/ML IJ SUSP
40.0000 mg | Freq: Once | INTRAMUSCULAR | Status: AC
  Administered 2024-05-07: 40 mg via INTRA_ARTICULAR

## 2024-05-07 NOTE — Progress Notes (Addendum)
   Subjective:    Patient ID: SANELA EVOLA, female    DOB: 48 y.o., Mar 03, 1976   MRN: [id number]  HPI  Chief Complaint: Capsulitis of 2nd and 3rd metatarsal phalangeal joints of the right foot  Patient presents for follow-up on the above complaint Seen previously by myself and diagnosed with flexor digitorum tenosynovitis, and capsulitis of the 2nd and 3rd MTP joints.  Presents today for ultrasound-guided injection of the second intermetatarsal space.     Objective:   Physical Exam Vitals:   05/07/24 0946  BP: 126/84    Morton's Neuroma Injection with Ultrasound Guidance Procedure Note KIRAT MEZQUITA 01/02/1976 Indications: Pain Procedure Details Verbal consent was obtained. Risks (including potential skin bleaching or atrophy), benefits, and alternatives were discussed. Potential complications including loss of pigment and atrophy were discussed. The right second webbed space was identified and the common plantar interdigital nerve was identified under US . Prepped with Chloraprep and Ethyl Chloride used for anesthesia. Under sterile conditions, the patient was injected immediately surrounding the common plantar interdigital nerve with 0.5 cc of Mepivicaine 2%, 0.25cc of Sodium Bicarbonate 8.4%, and 40 mg Depo-Medrol . Decreased pain after injection. No complications. Please see associated images saved in the medical record.  Examination of the patient's postop shoe with previously placed metatarsal pad shows that the metatarsal pad sits directly under the forefoot when she is wearing it.    Assessment & Plan:   Prerana is a 48 y.o. female with capsulitis of the 2nd and 3rd MTP joints in addition to some flexor digitorum tenosynovitis.  She tolerated the procedure under ultrasound guidance well today and the 2nd interspace was injected as well as the 2nd MTP joint.  Upon my evaluation of her postop shoe, it is clear that the placement of the metatarsal pad was too far distal and  was directly underneath the metatarsal heads and likely further compressing them when wearing it.  I removed the pad and placed a new 1 proximal to the metatarsal heads and directed her arm how it should fit with her trying the postop shoe and confirming fit today.  Minimize elective walking for the next 3-4 weeks.  Wear postop shoe for next 2-3 weeks.  Follow-up in 4 weeks could consider shockwave therapy to this area if pain persists.

## 2024-05-07 NOTE — Addendum Note (Signed)
 Addended by: Valeda Corzine A on: 05/07/2024 12:25 PM   Modules accepted: Orders

## 2024-06-04 ENCOUNTER — Ambulatory Visit (INDEPENDENT_AMBULATORY_CARE_PROVIDER_SITE_OTHER)

## 2024-06-04 ENCOUNTER — Ambulatory Visit (HOSPITAL_BASED_OUTPATIENT_CLINIC_OR_DEPARTMENT_OTHER)
Admission: RE | Admit: 2024-06-04 | Discharge: 2024-06-04 | Disposition: A | Discharge status: Home / Self Care | Source: Ambulatory Visit

## 2024-06-04 VITALS — BP 130/80 | Ht 66.0 in | Wt 160.0 lb

## 2024-06-04 DIAGNOSIS — M7751 Other enthesopathy of right foot: Secondary | ICD-10-CM

## 2024-06-04 DIAGNOSIS — M1812 Unilateral primary osteoarthritis of first carpometacarpal joint, left hand: Secondary | ICD-10-CM | POA: Diagnosis present

## 2024-06-04 NOTE — Progress Notes (Signed)
   Subjective:    Patient ID: Kirsten Herrera, female    DOB: 48 y.o., February 19, 1976   MRN: [id number]  Chief Complaint: Follow-up capsulitis of right foot 2nd and 3rd MTP joints, also new complaint in left thumb.  Discussed the use of AI scribe software for clinical note transcription with the patient, who gave verbal consent to proceed.  History of Present Illness MIU CHIONG is a 48 year old female who presents with foot pain and a hand issue.  Metatarsalgia - Foot pain has improved significantly after wearing a boot for two and a half weeks - Able to be on her feet more comfortably - Previously tried a metatarsal bar - Not using any specific medication for foot pain  Thumb and thenar pain with paresthesia - Hand pain has persisted for approximately eleven months - Pain localized to the bulk of the thumb, sometimes radiating distally - Onset after using electric rotary cutters at work - Pain exacerbated by lifting weights and pulling sheets - Occasional tingling sensation, but no numbness or tingling into the fingers - Not using any medication for hand pain  Occupational and handedness factors - Works as a neurosurgeon, 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 No bony deformity or atrophy of the hypothenar region -Palpation: TTP - DIP, - PIP, - MCP,  - metacarpals, - scaphoid/snuff box, - scaphoid tubercle, + first CMC -AROM/PROM: Full flexion, extension, supination, pronation of the wrist. Full flexion and extension of the fingers, with and without isolation of the DIP. -Strength: 5/5 flexion, 5/5 extension (radial), 5/5 pronation, 5/5 supination, 5/5 finger abduction (ulnar), 5/5 Ok sign  (median) -sensation: intact on dorsum of hand (radial), 4th/5th digits (ulnar), 1st-3rd digits (median) -Special tests: - Finklestein, - Eichoff, - Tinel at wrist, - Durkin compression test, ++ CMC grind, - TFCC grind     Assessment & Plan:   Assessment & Plan Left thumb carpometacarpal joint osteoarthritis   Chronic pain in the left thumb carpometacarpal joint is likely due to osteoarthritis, presenting with pain and occasional tingling, especially during activities requiring grip or pressure. Arthritis is confirmed by clinical examination. Order x-rays of the left thumb to confirm the diagnosis. Prescribe Voltaren gel for topical application. Consider a thumb brace to minimize motion and provide rest. Discuss the potential for a steroid injection if conservative measures are insufficient. Schedule a follow-up appointment in four weeks.  Chronic right foot pain with capsulitis and intermetatarsal bursitis   Her chronic right foot pain, capsulitis of the third MTP joint, and intermetatarsal bursitis have significantly improved after footwear adjustments and use of a metatarsal bar. She is satisfied with the progress and prefers conservative management. Apply Voltaren gel to the right foot as needed. Consider shockwave therapy if symptoms recur, with treatments once weekly for 3-5 weeks. Pause anti-inflammatory medications during shockwave therapy if pursued. Encourage a gradual increase in elective walking, starting every other day and progressing as tolerated.

## 2024-06-05 ENCOUNTER — Ambulatory Visit: Payer: Self-pay

## 2024-06-11 ENCOUNTER — Telehealth: Payer: Self-pay | Admitting: Medical-Surgical

## 2024-06-11 NOTE — Telephone Encounter (Signed)
 Copied from CRM [id number]. Topic: General - Billing Inquiry >> Jun 11, 2024  1:15 PM Charlet HERO wrote: Reason for CRM: Patient is calling about bill for [facility] radiology that it was denied for the physican did not send back the information for why it it does not include the reason for the visit and it needs to be sent to [facility] radiology.

## 2024-06-25 ENCOUNTER — Telehealth: Payer: Self-pay

## 2024-06-25 NOTE — Telephone Encounter (Signed)
 Copied from CRM [id number]. Topic: General - Other >> Jun 25, 2024  2:56 PM Dedra B wrote: Reason for CRM: Pt is following up on request to have information regarding why she needed an back MRI sent to [facility] so that her insurance will cover it. MRI was ordered by Dr. ONEIDA 02/2024.

## 2024-07-16 ENCOUNTER — Encounter: Payer: Self-pay | Admitting: Medical-Surgical

## 2024-07-16 ENCOUNTER — Ambulatory Visit: Admitting: Medical-Surgical

## 2024-07-16 VITALS — BP 143/102 | HR 70 | Resp 20 | Ht 66.0 in | Wt 165.4 lb

## 2024-07-16 DIAGNOSIS — F909 Attention-deficit hyperactivity disorder, unspecified type: Secondary | ICD-10-CM

## 2024-07-16 DIAGNOSIS — R030 Elevated blood-pressure reading, without diagnosis of hypertension: Secondary | ICD-10-CM

## 2024-07-16 DIAGNOSIS — Z23 Encounter for immunization: Secondary | ICD-10-CM

## 2024-07-16 MED ORDER — AMPHETAMINE-DEXTROAMPHET ER 20 MG PO CP24: 20.0000 mg | ORAL_CAPSULE | ORAL | 0 refills | Status: DC

## 2024-07-16 NOTE — Progress Notes (Signed)
 Has been under a lot of stress lately since her husband was layed off from his job and there is a lot of uncertainty. Feel this is contributing to her worsened ADHD symptoms as well as her BP elevation.   Medical screening examination/treatment was performed by qualified clinical staff member and as supervising provider I was immediately available for consultation/collaboration. I have reviewed documentation and agree with assessment and plan.  Zada FREDRIK Palin, DNP, APRN, FNP-BC [facility] and [facility] Medicine

## 2024-07-16 NOTE — Progress Notes (Addendum)
° °  Established Patient Office Visit  Subjective   Patient ID: Kirsten Herrera, female    DOB: 10-04-1975  Age: 48 y.o. MRN: [id number]  Chief Complaint  Patient presents with   ADHD    HPI  48 year old female presents for a 6 month follow up on the following  ADHD She is currently being treated with Vyvanse  50 mg capsules every morning. States that she would like to increase dosage due to not seeing any changes. Reports having symptoms of binge eating, lack of focus, and impulsivity over the last 6 weeks. Denies changes in appetite, mood, or sleep.  Review of Systems  Constitutional: Negative.   HENT: Negative.    Eyes: Negative.   Respiratory: Negative.    Cardiovascular: Negative.   Gastrointestinal: Negative.   Genitourinary: Negative.   Musculoskeletal: Negative.   Skin: Negative.   Neurological: Negative.   Endo/Heme/Allergies: Negative.   Psychiatric/Behavioral: Negative.        Objective:     BP (!) 144/82 (BP Location: Left Arm, Cuff Size: Normal)   Pulse 71   Resp 20   Ht 5' 6 (1.676 m)   Wt 75 kg   SpO2 100%   BMI 26.70 kg/m  BP Readings from Last 3 Encounters:  07/16/24 (!) 144/82  06/04/24 130/80  05/07/24 126/84      Physical Exam Vitals and nursing note reviewed.  Constitutional:      General: She is not in acute distress.    Appearance: Normal appearance.  Cardiovascular:     Rate and Rhythm: Normal rate and regular rhythm.     Pulses: Normal pulses.     Heart sounds: Normal heart sounds.  Pulmonary:     Effort: Pulmonary effort is normal.     Breath sounds: Normal breath sounds.  Neurological:     General: No focal deficit present.     Mental Status: She is alert and oriented to person, place, and time.  Psychiatric:        Mood and Affect: Mood normal.        Behavior: Behavior normal.        Thought Content: Thought content normal.        Judgment: Judgment normal.     No results found for any visits on 07/16/24.   The  ASCVD Risk score (Arnett DK, et al., 2019) failed to calculate for the following reasons:   Cannot find a previous HDL lab   Cannot find a previous total cholesterol lab   * - Cholesterol units were assumed    Assessment & Plan:  1. Attention deficit hyperactivity disorder (ADHD), unspecified ADHD type (Primary) -Discontinue Vyvanse  50 mg -Start on Adderall XR 20 mg daily -Return in 1 month to assess response to therapy  2. Elevated blood pressure reading without diagnosis of hypertension -Discussed limiting salt intake -Advised patient to monitor blood pressures at home daily and document readings -Return in 2 weeks for Nurse visit BP Check  3. Need for influenza vaccination -Flu vaccine given today     Return in about 2 weeks (around 07/30/2024) for Nurse Visit BP Check.    Derrek JINNY Freund, NP Student

## 2024-07-31 ENCOUNTER — Ambulatory Visit (INDEPENDENT_AMBULATORY_CARE_PROVIDER_SITE_OTHER)

## 2024-07-31 VITALS — BP 127/91 | HR 87 | Resp 17 | Ht 66.0 in | Wt 165.0 lb

## 2024-07-31 DIAGNOSIS — R030 Elevated blood-pressure reading, without diagnosis of hypertension: Secondary | ICD-10-CM

## 2024-07-31 MED ORDER — HYDROCHLOROTHIAZIDE 12.5 MG PO TABS: 12.5000 mg | ORAL_TABLET | Freq: Every day | ORAL | 3 refills | Status: AC

## 2024-07-31 NOTE — Progress Notes (Signed)
" ° °  Subjective:    Patient ID: Kirsten Herrera, female    DOB: 04/07/1976, 48 y.o.   MRN: [id number]  HPI  Patient is here for a 2 wk BP check. Last OV it was 143102. Patient is currently not taking any medication. Denies CP, SOB. Heart palpitations, or medication issues.  Review of Systems     Objective:   Physical Exam        Assessment & Plan:   Patient first BP is 144/87 second is 127/91. Reported to PCP joy who would like to pt to work on diet, exercise, staying hydrated. Also wanted to know if pt wanted to start on a low dose of the hydrochlorothiazide . Patient is willing to try the medication. Patient has an appointment with joy for medication f/u on 08/13/24. Pt will cont. to monitor at home and f/u with joy when she comes in on the 8th.      "

## 2024-07-31 NOTE — Addendum Note (Signed)
 Addended byBETHA WILLO MINI on: 07/31/2024 01:05 PM   Modules accepted: Orders

## 2024-08-13 ENCOUNTER — Encounter: Payer: Self-pay | Admitting: Medical-Surgical

## 2024-08-13 ENCOUNTER — Ambulatory Visit: Admitting: Medical-Surgical

## 2024-08-13 VITALS — BP 132/84 | HR 83 | Temp 98.6°F | Resp 20 | Ht 66.0 in | Wt 170.0 lb

## 2024-08-13 DIAGNOSIS — F909 Attention-deficit hyperactivity disorder, unspecified type: Secondary | ICD-10-CM | POA: Diagnosis not present

## 2024-08-13 MED ORDER — LISDEXAMFETAMINE DIMESYLATE 60 MG PO CAPS: 60.0000 mg | ORAL_CAPSULE | ORAL | 0 refills | Status: AC

## 2024-08-13 MED ORDER — TOPIRAMATE 25 MG PO TABS: 25.0000 mg | ORAL_TABLET | Freq: Two times a day (BID) | ORAL | 0 refills | Status: DC

## 2024-08-13 NOTE — Progress Notes (Signed)
 "       Established patient visit   History of Present Illness   Discussed the use of AI scribe software for clinical note transcription with the patient, who gave verbal consent to proceed.  History of Present Illness   Kirsten Herrera is a 49 year old female with ADHD and binge eating disorder who presents for follow-up on ADHD medication management.  Attention deficit hyperactivity disorder (adhd) symptoms and medication response - Currently taking Adderall, which improves focus for only 2 to 3 hours before wearing off. - Vyvanse  was previously more effective, particularly for appetite suppression, but was taken at a higher dose. - Concerta was ineffective for symptom control and not well tolerated.  Binge eating disorder and weight management - Ongoing difficulty controlling eating with progressive weight gain. - Continues to log food intake, increase physical activity, and consume higher amounts of water. - Frustrated by lack of control over eating behaviors. - Vyvanse  was helpful for this  Hydration and diuretic use - Drinks nearly three-quarters of a gallon of water daily, supplemented with electrolytes. - Adding hydrochlorothiazide  has helped increase her water intake.  Blood pressure monitoring and anxiety - Elevated blood pressure readings in medical settings. - Normal blood pressure values at home. - Attributes in-clinic elevations to anxiety during visits. - Monitors blood pressure at home for reassurance.      Physical Exam   Physical Exam Vitals reviewed.  Constitutional:      General: She is not in acute distress.    Appearance: Normal appearance.  HENT:     Head: Normocephalic and atraumatic.  Cardiovascular:     Rate and Rhythm: Normal rate and regular rhythm.     Pulses: Normal pulses.     Heart sounds: Normal heart sounds. No murmur heard.    No friction rub. No gallop.  Pulmonary:     Effort: Pulmonary effort is normal. No respiratory distress.      Breath sounds: Normal breath sounds. No wheezing.  Skin:    General: Skin is warm and dry.  Neurological:     Mental Status: She is alert and oriented to person, place, and time.  Psychiatric:        Mood and Affect: Mood normal.        Behavior: Behavior normal.        Thought Content: Thought content normal.        Judgment: Judgment normal.    Assessment & Plan   Attention-deficit hyperactivity disorder Currently on Adderall, prefers Vyvanse  for better symptom control and appetite suppression. Blood pressure well-managed, allowing Vyvanse  adjustment. - Switching back to Vyvanse  but going up to 60 mg daily. - Instructed to monitor blood pressure at home. - Instructed to send a message after 3-4 weeks to report on effectiveness and any side effects.  Binge eating disorder Struggling with binge eating. Vyvanse  may help with appetite suppression. Topamax  considered for additional appetite suppression and taste alteration. - Started Topamax , 25mg  daily in the morning, option to increase to twice daily after one week if tolerated. - Instructed to monitor for side effects, including taste changes and tingling sensations. - Restarting Vyvanse .  Obesity Actively managing weight through diet, exercise, and increased water intake. Vyvanse  and Topamax  may aid in weight management. - Monitor weight and dietary intake.  Hypertension Blood pressure well-controlled at home, elevated in office likely due to white coat syndrome. - Continue hydrochlorothiazide  as prescribed.  - Continue home blood pressure monitoring. - Recheck blood pressure in  the office if needed.     Follow up   Return in about 3 months (around 11/11/2024) for ADHD/binge eating/BP follow up. __________________________________ Zada FREDRIK Palin, DNP, APRN, FNP-BC Primary Care and Sports Medicine [facility] [place] "

## 2024-08-21 ENCOUNTER — Encounter: Payer: Self-pay | Admitting: Medical-Surgical

## 2024-08-21 ENCOUNTER — Other Ambulatory Visit: Payer: Self-pay | Admitting: Medical-Surgical

## 2024-08-21 MED ORDER — ONDANSETRON 4 MG PO TBDP: 4.0000 mg | ORAL_TABLET | Freq: Three times a day (TID) | ORAL | 0 refills | Status: AC | PRN

## 2024-09-10 ENCOUNTER — Other Ambulatory Visit: Payer: Self-pay | Admitting: Medical-Surgical

## 2024-11-12 ENCOUNTER — Ambulatory Visit: Admitting: Medical-Surgical
